# Patient Record
Sex: Female | Born: 1970
Health system: Southern US, Community
[De-identification: ages and names within clinical notes are randomized; demographics above are authoritative.]

## PROBLEM LIST (undated history)

## (undated) DIAGNOSIS — F988 Other specified behavioral and emotional disorders with onset usually occurring in childhood and adolescence: Secondary | ICD-10-CM

## (undated) DIAGNOSIS — J189 Pneumonia, unspecified organism: Secondary | ICD-10-CM

## (undated) DIAGNOSIS — D219 Benign neoplasm of connective and other soft tissue, unspecified: Secondary | ICD-10-CM

## (undated) DIAGNOSIS — K219 Gastro-esophageal reflux disease without esophagitis: Secondary | ICD-10-CM

## (undated) DIAGNOSIS — G43909 Migraine, unspecified, not intractable, without status migrainosus: Secondary | ICD-10-CM

## (undated) DIAGNOSIS — E538 Deficiency of other specified B group vitamins: Secondary | ICD-10-CM

## (undated) DIAGNOSIS — R87619 Unspecified abnormal cytological findings in specimens from cervix uteri: Secondary | ICD-10-CM

## (undated) DIAGNOSIS — F4310 Post-traumatic stress disorder, unspecified: Secondary | ICD-10-CM

## (undated) HISTORY — PX: TONSILLECTOMY: SUR1361

## (undated) HISTORY — DX: Post-traumatic stress disorder, unspecified: F43.10

## (undated) HISTORY — DX: Benign neoplasm of connective and other soft tissue, unspecified: D21.9

## (undated) HISTORY — PX: SPINE SURGERY: SHX786

## (undated) HISTORY — PX: OTHER SURGICAL HISTORY: SHX169

## (undated) HISTORY — PX: DILATION AND CURETTAGE OF UTERUS: SHX78

## (undated) HISTORY — PX: APPENDECTOMY: SHX54

## (undated) HISTORY — DX: Other specified behavioral and emotional disorders with onset usually occurring in childhood and adolescence: F98.8

## (undated) HISTORY — DX: Unspecified abnormal cytological findings in specimens from cervix uteri: R87.619

---

## 2003-02-12 HISTORY — PX: AUGMENTATION MAMMAPLASTY: SUR837

## 2007-10-31 ENCOUNTER — Ambulatory Visit: Payer: Self-pay | Admitting: Family Medicine

## 2010-08-17 DIAGNOSIS — F341 Dysthymic disorder: Secondary | ICD-10-CM | POA: Insufficient documentation

## 2010-08-17 DIAGNOSIS — F4310 Post-traumatic stress disorder, unspecified: Secondary | ICD-10-CM | POA: Insufficient documentation

## 2010-08-17 DIAGNOSIS — F988 Other specified behavioral and emotional disorders with onset usually occurring in childhood and adolescence: Secondary | ICD-10-CM | POA: Insufficient documentation

## 2010-08-17 DIAGNOSIS — F429 Obsessive-compulsive disorder, unspecified: Secondary | ICD-10-CM | POA: Insufficient documentation

## 2013-05-19 DIAGNOSIS — R002 Palpitations: Secondary | ICD-10-CM | POA: Insufficient documentation

## 2014-08-10 DIAGNOSIS — H5213 Myopia, bilateral: Secondary | ICD-10-CM | POA: Insufficient documentation

## 2014-08-10 DIAGNOSIS — H52203 Unspecified astigmatism, bilateral: Secondary | ICD-10-CM

## 2015-06-19 DIAGNOSIS — E538 Deficiency of other specified B group vitamins: Secondary | ICD-10-CM | POA: Insufficient documentation

## 2015-06-19 DIAGNOSIS — E559 Vitamin D deficiency, unspecified: Secondary | ICD-10-CM | POA: Insufficient documentation

## 2015-11-24 ENCOUNTER — Encounter: Payer: Self-pay | Admitting: Physician Assistant

## 2015-11-24 ENCOUNTER — Ambulatory Visit: Payer: Self-pay | Admitting: Physician Assistant

## 2015-11-24 VITALS — BP 94/70 | HR 82 | Temp 98.2°F

## 2015-11-24 DIAGNOSIS — B349 Viral infection, unspecified: Secondary | ICD-10-CM

## 2015-11-24 LAB — POCT INFLUENZA A/B
INFLUENZA A, POC: NEGATIVE
INFLUENZA B, POC: NEGATIVE

## 2015-11-24 MED ORDER — PROMETHAZINE HCL 25 MG PO TABS: 25.0000 mg | ORAL_TABLET | Freq: Three times a day (TID) | ORAL | 0 refills | Status: DC | PRN

## 2015-11-24 NOTE — Progress Notes (Signed)
S: C/o runny nose and congestion with dry cough for 1 days, + fever, chills, some v/d; denies cp/so; throat is sore;  cough is sporadic,   Using otc meds: robitussin  O: PE: vitals wnl, nad,  perrl eomi, normocephalic, tms dull, nasal mucosa red and swollen, throat injected, neck supple no lymph, lungs c t a, cv rrr, abd soft nontender, neuro intact, flu swab neg  A:  Acute flu like illness   P: drink fluids, continue regular meds , use otc meds of choice, return if not improving in 3- 5 days, return earlier if worsening  Phenergan for nausea

## 2016-01-03 ENCOUNTER — Ambulatory Visit: Payer: Self-pay | Admitting: Physician Assistant

## 2016-01-31 ENCOUNTER — Ambulatory Visit: Payer: Self-pay | Admitting: Internal Medicine

## 2016-03-19 ENCOUNTER — Ambulatory Visit: Payer: Self-pay | Admitting: Physician Assistant

## 2016-03-19 ENCOUNTER — Encounter: Payer: Self-pay | Admitting: Physician Assistant

## 2016-03-19 VITALS — BP 100/70 | HR 70 | Temp 97.9°F

## 2016-03-19 DIAGNOSIS — Z20828 Contact with and (suspected) exposure to other viral communicable diseases: Secondary | ICD-10-CM

## 2016-03-19 DIAGNOSIS — R110 Nausea: Secondary | ICD-10-CM

## 2016-03-19 LAB — POCT URINALYSIS DIPSTICK
Bilirubin, UA: NEGATIVE
GLUCOSE UA: NEGATIVE
KETONES UA: NEGATIVE
Leukocytes, UA: NEGATIVE
NITRITE UA: NEGATIVE
PH UA: 6
Protein, UA: NEGATIVE
RBC UA: NEGATIVE
Spec Grav, UA: 1.01
Urobilinogen, UA: 0.2

## 2016-03-19 LAB — POCT INFLUENZA A/B
INFLUENZA A, POC: NEGATIVE
INFLUENZA B, POC: NEGATIVE

## 2016-03-19 LAB — POCT URINE PREGNANCY: Preg Test, Ur: NEGATIVE

## 2016-03-19 MED ORDER — OSELTAMIVIR PHOSPHATE 75 MG PO CAPS: 75.0000 mg | ORAL_CAPSULE | Freq: Every day | ORAL | 0 refills | Status: DC

## 2016-03-19 NOTE — Progress Notes (Signed)
S: C/o runny nose and congestion, + fever, chills today, son tested + for flu and strep, denies cp/sob, v/d; states she has been dizzy and nauseated, some ear pain and pressure; no v/d,   Using otc meds:sudafed  O: PE: vitals wnl, nad,  perrl eomi, normocephalic, tms dull, nasal mucosa wnl throat pale pink,  neck supple no lymph, lungs c t a, cv rrr, neuro intact, flu swab neg, ua wnl, urine preg neg  A:  Dysfunction of eustachean tube, influenza exposure   P: drink fluids, continue regular meds , use otc meds of choice, return if not improving in 5 days, return earlier if worsening , pt does not want to take prednisone, advised she take ibuprofen with sudafed and flonase, if ear pain is not better in 1 week then she can call ENT for appointment, tamiflu preventive 1 po qd

## 2016-09-17 ENCOUNTER — Encounter: Payer: Self-pay | Admitting: *Deleted

## 2016-09-17 ENCOUNTER — Ambulatory Visit
Admission: EM | Admit: 2016-09-17 | Discharge: 2016-09-17 | Disposition: A | Discharge status: Home / Self Care | Payer: 59 | Attending: Emergency Medicine | Admitting: Emergency Medicine

## 2016-09-17 DIAGNOSIS — Z9889 Other specified postprocedural states: Secondary | ICD-10-CM | POA: Insufficient documentation

## 2016-09-17 DIAGNOSIS — H53149 Visual discomfort, unspecified: Secondary | ICD-10-CM | POA: Diagnosis not present

## 2016-09-17 DIAGNOSIS — Z8249 Family history of ischemic heart disease and other diseases of the circulatory system: Secondary | ICD-10-CM | POA: Insufficient documentation

## 2016-09-17 DIAGNOSIS — G43909 Migraine, unspecified, not intractable, without status migrainosus: Secondary | ICD-10-CM | POA: Diagnosis not present

## 2016-09-17 DIAGNOSIS — R55 Syncope and collapse: Secondary | ICD-10-CM | POA: Diagnosis not present

## 2016-09-17 DIAGNOSIS — Z79899 Other long term (current) drug therapy: Secondary | ICD-10-CM | POA: Diagnosis not present

## 2016-09-17 DIAGNOSIS — R51 Headache: Secondary | ICD-10-CM

## 2016-09-17 DIAGNOSIS — R519 Headache, unspecified: Secondary | ICD-10-CM

## 2016-09-17 DIAGNOSIS — F4310 Post-traumatic stress disorder, unspecified: Secondary | ICD-10-CM | POA: Diagnosis not present

## 2016-09-17 DIAGNOSIS — R002 Palpitations: Secondary | ICD-10-CM

## 2016-09-17 DIAGNOSIS — R42 Dizziness and giddiness: Secondary | ICD-10-CM

## 2016-09-17 DIAGNOSIS — R5383 Other fatigue: Secondary | ICD-10-CM | POA: Diagnosis not present

## 2016-09-17 DIAGNOSIS — R531 Weakness: Secondary | ICD-10-CM | POA: Insufficient documentation

## 2016-09-17 LAB — URINALYSIS, COMPLETE (UACMP) WITH MICROSCOPIC
BILIRUBIN URINE: NEGATIVE
Glucose, UA: NEGATIVE mg/dL
HGB URINE DIPSTICK: NEGATIVE
KETONES UR: NEGATIVE mg/dL
Leukocytes, UA: NEGATIVE
Nitrite: NEGATIVE
PROTEIN: NEGATIVE mg/dL
RBC / HPF: NONE SEEN RBC/hpf (ref 0–5)
Specific Gravity, Urine: 1.015 (ref 1.005–1.030)
pH: 7 (ref 5.0–8.0)

## 2016-09-17 LAB — CBC WITH DIFFERENTIAL/PLATELET
BASOS PCT: 0 %
Basophils Absolute: 0 10*3/uL (ref 0–0.1)
Eosinophils Absolute: 0.1 10*3/uL (ref 0–0.7)
Eosinophils Relative: 1 %
HEMATOCRIT: 40.2 % (ref 35.0–47.0)
HEMOGLOBIN: 13.6 g/dL (ref 12.0–16.0)
LYMPHS ABS: 2.9 10*3/uL (ref 1.0–3.6)
Lymphocytes Relative: 40 %
MCH: 33.7 pg (ref 26.0–34.0)
MCHC: 33.7 g/dL (ref 32.0–36.0)
MCV: 99.9 fL (ref 80.0–100.0)
MONOS PCT: 11 %
Monocytes Absolute: 0.8 10*3/uL (ref 0.2–0.9)
NEUTROS ABS: 3.5 10*3/uL (ref 1.4–6.5)
NEUTROS PCT: 48 %
Platelets: 195 10*3/uL (ref 150–440)
RBC: 4.02 MIL/uL (ref 3.80–5.20)
RDW: 12.8 % (ref 11.5–14.5)
WBC: 7.4 10*3/uL (ref 3.6–11.0)

## 2016-09-17 LAB — COMPREHENSIVE METABOLIC PANEL
ALT: 17 U/L (ref 14–54)
ANION GAP: 6 (ref 5–15)
AST: 19 U/L (ref 15–41)
Albumin: 4.7 g/dL (ref 3.5–5.0)
Alkaline Phosphatase: 41 U/L (ref 38–126)
BUN: 17 mg/dL (ref 6–20)
CALCIUM: 8.8 mg/dL — AB (ref 8.9–10.3)
CHLORIDE: 106 mmol/L (ref 101–111)
CO2: 24 mmol/L (ref 22–32)
Creatinine, Ser: 0.58 mg/dL (ref 0.44–1.00)
GFR calc non Af Amer: >60 mL/min (ref >60)
Glucose, Bld: 99 mg/dL (ref 65–99)
Potassium: 3.6 mmol/L (ref 3.5–5.1)
SODIUM: 136 mmol/L (ref 135–145)
Total Bilirubin: 0.8 mg/dL (ref 0.3–1.2)
Total Protein: 7.6 g/dL (ref 6.5–8.1)

## 2016-09-17 MED ORDER — ACETAMINOPHEN 500 MG PO TABS
1000.0000 mg | ORAL_TABLET | Freq: Once | ORAL | Status: AC
  Administered 2016-09-17: 1000 mg via ORAL

## 2016-09-17 MED ORDER — KETOROLAC TROMETHAMINE 30 MG/ML IJ SOLN
30.0000 mg | Freq: Once | INTRAMUSCULAR | Status: AC
  Administered 2016-09-17: 30 mg via INTRAVENOUS

## 2016-09-17 MED ORDER — SODIUM CHLORIDE 0.9 % IV BOLUS (SEPSIS)
1000.0000 mL | Freq: Once | INTRAVENOUS | Status: AC
  Administered 2016-09-17: 1000 mL via INTRAVENOUS

## 2016-09-17 MED ORDER — IBUPROFEN 600 MG PO TABS: 600.0000 mg | ORAL_TABLET | Freq: Four times a day (QID) | ORAL | 0 refills | Status: DC | PRN

## 2016-09-17 NOTE — ED Provider Notes (Signed)
HPI  SUBJECTIVE:  Jaime Fletcher is a 46 y.o. female who presents with weakness and fatigue starting today. She reports a gradual onset, constant diffuse headache especially behind her eyes starting today and some photophobia. States that her teeth and jaw hurt and Thought that this may be due to her new Invisilign braces so took these out with no improvement in her symptoms. She denies neck stiffness, rash. this is not the worst headache of her life, but states this feels different than her usual migraine headaches. She denies nasal congestion, sinus pain or pressure. She also reports multiple episodes of tunnel vision, palpitations described as her heart beating fast, dizziness described as lightheadedness lasting seconds and then resolving. She states that she noted her heart rate to be 50 on her watch during an episode. She says her heart rate is normally in the 80s and 90s.  States she was at her desk when this started. States she tried pushing fluids, Gatorade and walking around without improvement in her symptoms. She states that her symptoms get worse with walking and with large positional changes. She denies irregular heartbeat. No vomiting, tinnitus, vertigo, diaphoresis. No chest pain, shortness of breath, abdominal pain. No coughing, wheezing, hemoptysis. No calf pain, swelling. No syncope. No abdominal pain. She states that she is eating and drinking well. She denies any urinary symptoms. No epistaxis, heavy vaginal bleeding, melena, hematochezia, diarrhea. No change in her medications. No recent immobilization or surgery in the past 4 weeks. No recent trauma. Past medical history of anxiety, PTSD. She takes Klonopin and Xanax PRN. States that she is okay to skip a dose or 2 of these. No history of PE, DVT, diabetes, hypertension, MI, arrhythmia, hypercholesterolemia, HIV, smoking. She has a history of migraines and tension headaches. She has had a syncopal episode due to heat. Family history  significant for mother with an MI in her mid 56s. No history of sudden death. LMP: Amenorrheic due to Mirena. Denies possibility of being pregnant. PMD: Dr. Tamala Fothergill at [facility].  History reviewed. No pertinent past medical history.  Past Surgical History:  Procedure Laterality Date  . APPENDECTOMY    . SPINE SURGERY    . TONSILLECTOMY      History reviewed. No pertinent family history.  Social History  Substance Use Topics  . Smoking status: Never Smoker  . Smokeless tobacco: Never Used  . Alcohol use Yes    No current facility-administered medications for this encounter.   Current Outpatient Prescriptions:  .  ALPRAZolam (XANAX) 0.5 MG tablet, Take 0.5 mg by mouth at bedtime as needed for anxiety., Disp: , Rfl:  .  levonorgestrel (MIRENA) 20 MCG/24HR IUD, 1 each by Intrauterine route once., Disp: , Rfl:  .  ibuprofen (ADVIL,MOTRIN) 600 MG tablet, Take 1 tablet (600 mg total) by mouth every 6 (six) hours as needed., Disp: 30 tablet, Rfl: 0 .  vitamin B-12 (CYANOCOBALAMIN) 1000 MCG tablet, Take 1,000 mcg by mouth daily., Disp: , Rfl:   No Known Allergies   ROS  As noted in HPI.   Physical Exam  BP (!) 108/53 (BP Location: Left Arm)   Pulse 66   Temp 98.6 F (37 C) (Oral)   Resp 16   Ht 5\' 1"  (1.549 m)   Wt 110 lb (49.9 kg)   SpO2 100%   BMI 20.78 kg/m  Orthostatic VS for the past 24 hrs:  BP- Lying Pulse- Lying BP- Sitting Pulse- Sitting BP- Standing at 0 minutes Pulse- Standing  at 0 minutes  09/17/16 1752 101/48 63 102/58 77 104/63 77    Constitutional: Well developed, well nourished, no acute distress Eyes: PERRL, EOMI, conjunctiva normal bilaterally Mild bilateral photophobia HENT: Normocephalic, atraumatic,mucus membranes moist. No TMJ tenderness. TMs normal. No nasal congestion. No sinus tenderness. Positive diffuse dental tenderness. No dental caries. No temporal artery tenderness.  Neck: No cervical lymphadenopathy, meningismus. No trapezial  tenderness, muscle spasm. Endocrine: No thyroid tenderness or enlargement. Respiratory: Clear to auscultation bilaterally, no rales, no wheezing, no rhonchi Cardiovascular: Normal rate and rhythm, no murmurs, no gallops, no rubs GI: Soft, nondistended, normal bowel sounds, nontender, no rebound, no guarding Back: no CVAT skin: No rash, skin intact Musculoskeletal: No edema, no tenderness, no deformities Neurologic: Alert & oriented x 3, CN II-XII  intact, finger-nose, heel shin within normal limits. Tandem gait steady. Romberg negative. no motor deficits, sensation grossly intact Psychiatric: Speech and behavior appropriate   ED Course   Medications  acetaminophen (TYLENOL) tablet 1,000 mg (1,000 mg Oral Given 09/17/16 1901)  ketorolac (TORADOL) 30 MG/ML injection 30 mg (30 mg Intravenous Given 09/17/16 1917)  sodium chloride 0.9 % bolus 1,000 mL (1,000 mLs Intravenous New Bag/Given 09/17/16 1914)    Orders Placed This Encounter  Procedures  . Urinalysis, Complete w Microscopic    Standing Status:   Standing    Number of Occurrences:   1  . Comprehensive metabolic panel    Standing Status:   Standing    Number of Occurrences:   1  . CBC with Differential    Standing Status:   Standing    Number of Occurrences:   1  . Orthostatic vital signs    Standing Status:   Standing    Number of Occurrences:   1  . EKG 12-Lead    Standing Status:   Standing    Number of Occurrences:   1  . Insert peripheral IV    Standing Status:   Standing    Number of Occurrences:   1   Results for orders placed or performed during the hospital encounter of 09/17/16 (from the past 24 hour(s))  Urinalysis, Complete w Microscopic     Status: Abnormal   Collection Time: 09/17/16  6:33 PM  Result Value Ref Range   Color, Urine YELLOW YELLOW   APPearance CLEAR CLEAR   Specific Gravity, Urine 1.015 1.005 - 1.030   pH 7.0 5.0 - 8.0   Glucose, UA NEGATIVE NEGATIVE mg/dL   Hgb urine dipstick NEGATIVE  NEGATIVE   Bilirubin Urine NEGATIVE NEGATIVE   Ketones, ur NEGATIVE NEGATIVE mg/dL   Protein, ur NEGATIVE NEGATIVE mg/dL   Nitrite NEGATIVE NEGATIVE   Leukocytes, UA NEGATIVE NEGATIVE   Squamous Epithelial / LPF 6-30 (A) NONE SEEN   WBC, UA 0-5 0 - 5 WBC/hpf   RBC / HPF NONE SEEN 0 - 5 RBC/hpf   Bacteria, UA RARE (A) NONE SEEN  Comprehensive metabolic panel     Status: Abnormal   Collection Time: 09/17/16  6:57 PM  Result Value Ref Range   Sodium 136 135 - 145 mmol/L   Potassium 3.6 3.5 - 5.1 mmol/L   Chloride 106 101 - 111 mmol/L   CO2 24 22 - 32 mmol/L   Glucose, Bld 99 65 - 99 mg/dL   BUN 17 6 - 20 mg/dL   Creatinine, Ser 0.58 0.44 - 1.00 mg/dL   Calcium 8.8 (L) 8.9 - 10.3 mg/dL   Total Protein 7.6 6.5 - 8.1 g/dL  Albumin 4.7 3.5 - 5.0 g/dL   AST 19 15 - 41 U/L   ALT 17 14 - 54 U/L   Alkaline Phosphatase 41 38 - 126 U/L   Total Bilirubin 0.8 0.3 - 1.2 mg/dL   GFR calc non Af Amer >60 >60 mL/min   GFR calc Af Amer >60 >60 mL/min   Anion gap 6 5 - 15  CBC with Differential     Status: None   Collection Time: 09/17/16  6:57 PM  Result Value Ref Range   WBC 7.4 3.6 - 11.0 K/uL   RBC 4.02 3.80 - 5.20 MIL/uL   Hemoglobin 13.6 12.0 - 16.0 g/dL   HCT 40.2 35.0 - 47.0 %   MCV 99.9 80.0 - 100.0 fL   MCH 33.7 26.0 - 34.0 pg   MCHC 33.7 32.0 - 36.0 g/dL   RDW 12.8 11.5 - 14.5 %   Platelets 195 150 - 440 K/uL   Neutrophils Relative % 48 %   Neutro Abs 3.5 1.4 - 6.5 K/uL   Lymphocytes Relative 40 %   Lymphs Abs 2.9 1.0 - 3.6 K/uL   Monocytes Relative 11 %   Monocytes Absolute 0.8 0.2 - 0.9 K/uL   Eosinophils Relative 1 %   Eosinophils Absolute 0.1 0 - 0.7 K/uL   Basophils Relative 0 %   Basophils Absolute 0.0 0 - 0.1 K/uL   No results found.  ED Clinical Impression  Acute nonintractable headache, unspecified headache type  Lightheadedness   ED Assessment/Plan  EKG: Normal sinus rhythm, rate 69. Normal axis, normal intervals. No hypertrophy. No ST T wave changes.  No previous EKG for comparison.  Patient is not orthostatic but states that she was symptomatic while doing this.  1850-She had no episodes of bradycardia while in the department. She has no evidence of stroke or neurologic Emergency. discussed with the patient about the possibility of going to the ED, she states that she does not want to go. She states that she would prefer to have a limited workup here, get some fluids, Toradol, Tylenol. If she is completely better after this, then will send her home. If she does not improve, then sending her to the ER via EMS. She agrees with plan.  Checking CBC to rule out anemia, CMP to rule out electrolyte disorders and evaluate kidney function.  Labs reviewed. Normal CBC, CMP, UA. Reassuring EKG.  On reevaluation, she states that she feels significantly better after the IV fluids. She's had no further episodes of tunnel vision/lightheadedness dizziness. and Her headache has improved significantly after the Toradol and Tylenol. Patient may have been borderline dehydrated.   Discussed labs, imaging, MDM, plan and followup with patient. Discussed sn/sx that should prompt return to the ED. Patient agrees with plan.   Meds ordered this encounter  Medications  . acetaminophen (TYLENOL) tablet 1,000 mg  . ketorolac (TORADOL) 30 MG/ML injection 30 mg  . sodium chloride 0.9 % bolus 1,000 mL  . ibuprofen (ADVIL,MOTRIN) 600 MG tablet    Sig: Take 1 tablet (600 mg total) by mouth every 6 (six) hours as needed.    Dispense:  30 tablet    Refill:  0    *This clinic note was created using [employer]. Therefore, there may be occasional mistakes despite careful proofreading.  ?   Melynda Ripple, MD 09/17/16 2020

## 2016-09-17 NOTE — Discharge Instructions (Signed)
Push fluids, 600 mg ibuprofen with 1 gram of tylenol together as needed for headache. Go to the ER or call 911 for chest pain, pressure, heaviness, if these spells start to last longer, if your heart rate stays below 60 for a prolonged period of time, if you pass out or for other concerns

## 2016-09-17 NOTE — ED Triage Notes (Signed)
Sudden onset nausea, dizziness, and palpitations approx 2 hours ago. States feels weak and has noticed her heart rate slowing down.

## 2016-11-12 DIAGNOSIS — R51 Headache: Secondary | ICD-10-CM | POA: Diagnosis not present

## 2016-11-20 ENCOUNTER — Telehealth: Payer: Self-pay | Admitting: Obstetrics and Gynecology

## 2016-11-20 ENCOUNTER — Encounter: Payer: Self-pay | Admitting: Obstetrics and Gynecology

## 2016-11-20 ENCOUNTER — Ambulatory Visit (INDEPENDENT_AMBULATORY_CARE_PROVIDER_SITE_OTHER): Payer: 59 | Admitting: Obstetrics and Gynecology

## 2016-11-20 VITALS — BP 104/61 | HR 97 | Ht 61.0 in | Wt 114.2 lb

## 2016-11-20 DIAGNOSIS — Z30431 Encounter for routine checking of intrauterine contraceptive device: Secondary | ICD-10-CM

## 2016-11-20 DIAGNOSIS — E559 Vitamin D deficiency, unspecified: Secondary | ICD-10-CM

## 2016-11-20 DIAGNOSIS — Z01419 Encounter for gynecological examination (general) (routine) without abnormal findings: Secondary | ICD-10-CM

## 2016-11-20 DIAGNOSIS — Z30430 Encounter for insertion of intrauterine contraceptive device: Secondary | ICD-10-CM | POA: Insufficient documentation

## 2016-11-20 DIAGNOSIS — M797 Fibromyalgia: Secondary | ICD-10-CM | POA: Insufficient documentation

## 2016-11-20 DIAGNOSIS — Z1231 Encounter for screening mammogram for malignant neoplasm of breast: Secondary | ICD-10-CM | POA: Diagnosis not present

## 2016-11-20 DIAGNOSIS — E538 Deficiency of other specified B group vitamins: Secondary | ICD-10-CM | POA: Diagnosis not present

## 2016-11-20 DIAGNOSIS — F419 Anxiety disorder, unspecified: Secondary | ICD-10-CM | POA: Insufficient documentation

## 2016-11-20 DIAGNOSIS — R87619 Unspecified abnormal cytological findings in specimens from cervix uteri: Secondary | ICD-10-CM

## 2016-11-20 DIAGNOSIS — Z1239 Encounter for other screening for malignant neoplasm of breast: Secondary | ICD-10-CM

## 2016-11-20 NOTE — Progress Notes (Signed)
ANNUAL PREVENTATIVE CARE GYN  ENCOUNTER NOTE  Subjective:       Jaime Fletcher is a 46 y.o. G45P2013 female here for a routine annual gynecologic exam.  Current complaints: 1. Possible perimenopause 2. History of cervical CIS - Cold knife conization - 1996   Mirena currently in place as contraception. Denies periods, endorses occasional pink discharge that has lessened with time. Denies hot flashes/night sweats. Denies vaginal bleeding, dryness, itchiness.  History of cervical CIS, getting PAPs yearly. Endorses one abnormal result after cone knife conization, repeat three months later was normal.    Gynecologic History No LMP recorded (lmp unknown). Patient is not currently having periods (Reason: IUD). Contraception: IUD Last Pap: 05/2015 wnl. Results were: normal Last mammogram: 05/2015 wnl. Results were: normal  Obstetric History OB History  Gravida Para Term Preterm AB Living  3 2 2   1 3   SAB TAB Ectopic Multiple Live Births  1     1 3     # Outcome Date GA Lbr Len/2nd Weight Sex Delivery Anes PTL Lv  3 Term 2003   6 lb 14.4 oz (3.13 kg) M Vag-Spont   LIV  2 SAB 1996          1A Term 1991   5 lb 14.4 oz (2.676 kg) M Vag-Spont   LIV  1B Term 1991   4 lb (1.814 kg) F Vag-Spont   LIV      Past Medical History:  Diagnosis Date  . ADD (attention deficit disorder)   . PTSD (post-traumatic stress disorder)     Past Surgical History:  Procedure Laterality Date  . APPENDECTOMY    . DILATION AND CURETTAGE OF UTERUS    . SPINE SURGERY    . TONSILLECTOMY      Current Outpatient Prescriptions on File Prior to Visit  Medication Sig Dispense Refill  . ALPRAZolam (XANAX) 0.5 MG tablet Take 0.5 mg by mouth at bedtime as needed for anxiety.    Marland Kitchen levonorgestrel (MIRENA) 20 MCG/24HR IUD 1 each by Intrauterine route once.    . vitamin B-12 (CYANOCOBALAMIN) 1000 MCG tablet Take 1,000 mcg by mouth daily.     No current facility-administered medications on file prior to visit.      No Known Allergies  Social History   Social History  . Marital status: Divorced    Spouse name: N/A  . Number of children: N/A  . Years of education: N/A   Occupational History  . Not on file.   Social History Main Topics  . Smoking status: Never Smoker  . Smokeless tobacco: Never Used  . Alcohol use Yes  . Drug use: No  . Sexual activity: Not on file   Other Topics Concern  . Not on file   Social History Narrative  . No narrative on file    History reviewed. No pertinent family history.  The following portions of the patient's history were reviewed and updated as appropriate: allergies, current medications, past family history, past medical history, past social history, past surgical history and problem list.  Review of Systems Review of Systems  Constitutional: Negative for chills, fever and malaise/fatigue.  HENT: Negative for hearing loss and tinnitus.   Eyes: Negative for blurred vision, double vision and pain.  Respiratory: Negative for cough, shortness of breath and wheezing.   Cardiovascular: Negative for chest pain, palpitations, claudication and leg swelling.  Gastrointestinal: Negative for abdominal pain, constipation, diarrhea, nausea and vomiting.  Genitourinary: Negative for dysuria, frequency and urgency.  Musculoskeletal: Negative for myalgias.  Skin: Negative for rash.  Neurological: Negative for dizziness, loss of consciousness, weakness and headaches.     Objective:   BP 104/61   Pulse 97   Ht 5\' 1"  (1.549 m)   Wt 114 lb 3.2 oz (51.8 kg)   LMP  (LMP Unknown) Comment: iud  BMI 21.58 kg/m  CONSTITUTIONAL: Well-developed, well-nourished female in no acute distress.  PSYCHIATRIC: Normal mood and affect. Normal behavior. Normal judgment and thought content. [place]: Alert and oriented to person, place, and time. Normal muscle tone coordination. No cranial nerve deficit noted. HENT:  Normocephalic, atraumatic, External right and left ear  normal. Oropharynx is clear and moist EYES: Conjunctivae and EOM are normal. Pupils are equal, round, and reactive to light. No scleral icterus.  NECK: Normal range of motion, supple, no masses.  Normal thyroid.  SKIN: Skin is warm and dry. No rash noted. Not diaphoretic. No erythema. No pallor. CARDIOVASCULAR: Normal heart rate noted, regular rhythm, no murmur. RESPIRATORY: Clear to auscultation bilaterally. Effort and breath sounds normal, no problems with respiration noted. BREASTS: Symmetric in size. No masses, skin changes, nipple drainage, or lymphadenopathy. ABDOMEN: Soft, normal bowel sounds, no distention noted.  No tenderness, rebound or guarding.  BLADDER: Normal PELVIC:  External Genitalia: Normal  BUS: Normal  Vagina: Normal  Cervix: Normal, no cervical motion tenderness; IUD strings 2 cm  Uterus: anteverted, small size, mobile, nontender  Adnexa: Normal  RV: External Exam NormaI, No Rectal Masses and Normal Sphincter tone  MUSCULOSKELETAL: Normal range of motion. No tenderness.  No cyanosis, clubbing, or edema.  2+ distal pulses. LYMPHATIC: No Axillary, Supraclavicular, or Inguinal Adenopathy.   Assessment:   Annual gynecologic examination 46 y.o. Contraception: IUD Normal BMI Problem List Items Addressed This Visit    None      Plan:  Pap: Pap Co Test Mammogram: Ordered Stool Guaiac Testing:  Not Indicated Labs: lipid fbs a1c tsh Routine preventative health maintenance measures emphasized: Exercise/Diet/Weight control, Tobacco Warnings, Alcohol/Substance use risks, Stress Management and Safe Sex Return to Clinic - [address] [place], CMA  [employer], PA-S Brayton Mars, MD   I have seen, interviewed, and examined the patient in conjunction with the [employer].A. student and affirm the diagnosis and management plan. Icela Glymph A. Apryle Stowell, MD, FACOG   Note: This dictation was prepared with Dragon dictation along with smaller phrase  technology. Any transcriptional errors that result from this process are unintentional.

## 2016-11-20 NOTE — Telephone Encounter (Signed)
Patient would like to add vit d, b12 and iron to her lab work .Thanks

## 2016-11-20 NOTE — Patient Instructions (Signed)
1. Pap smear is done 2. Mammogram is ordered 3. Screening labs are ordered 4. Contraception-Mirena IUD 5. Continue with healthy eating and exercise 6. Return in 1 year for annual exam  Health Maintenance, Female Adopting a healthy lifestyle and getting preventive care can go a long way to promote health and wellness. Talk with your health care provider about what schedule of regular examinations is right for you. This is a good chance for you to check in with your provider about disease prevention and staying healthy. In between checkups, there are plenty of things you can do on your own. Experts have done a lot of research about which lifestyle changes and preventive measures are most likely to keep you healthy. Ask your health care provider for more information. Weight and diet Eat a healthy diet  Be sure to include plenty of vegetables, fruits, low-fat dairy products, and lean protein.  Do not eat a lot of foods high in solid fats, added sugars, or salt.  Get regular exercise. This is one of the most important things you can do for your health. ? Most adults should exercise for at least 150 minutes each week. The exercise should increase your heart rate and make you sweat (moderate-intensity exercise). ? Most adults should also do strengthening exercises at least twice a week. This is in addition to the moderate-intensity exercise.  Maintain a healthy weight  Body mass index (BMI) is a measurement that can be used to identify possible weight problems. It estimates body fat based on height and weight. Your health care provider can help determine your BMI and help you achieve or maintain a healthy weight.  For females 52 years of age and older: ? A BMI below 18.5 is considered underweight. ? A BMI of 18.5 to 24.9 is normal. ? A BMI of 25 to 29.9 is considered overweight. ? A BMI of 30 and above is considered obese.  Watch levels of cholesterol and blood lipids  You should start  having your blood tested for lipids and cholesterol at 45 years of age, then have this test every 5 years.  You may need to have your cholesterol levels checked more often if: ? Your lipid or cholesterol levels are high. ? You are older than 45 years of age. ? You are at high risk for heart disease.  Cancer screening Lung Cancer  Lung cancer screening is recommended for adults 67-49 years old who are at high risk for lung cancer because of a history of smoking.  A yearly low-dose CT scan of the lungs is recommended for people who: ? Currently smoke. ? Have quit within the past 15 years. ? Have at least a 30-pack-year history of smoking. A pack year is smoking an average of one pack of cigarettes a day for 1 year.  Yearly screening should continue until it has been 15 years since you quit.  Yearly screening should stop if you develop a health problem that would prevent you from having lung cancer treatment.  Breast Cancer  Practice breast self-awareness. This means understanding how your breasts normally appear and feel.  It also means doing regular breast self-exams. Let your health care provider know about any changes, no matter how small.  If you are in your 20s or 30s, you should have a clinical breast exam (CBE) by a health care provider every 1-3 years as part of a regular health exam.  If you are 70 or older, have a CBE every year. Also consider  having a breast X-ray (mammogram) every year.  If you have a family history of breast cancer, talk to your health care provider about genetic screening.  If you are at high risk for breast cancer, talk to your health care provider about having an MRI and a mammogram every year.  Breast cancer gene (BRCA) assessment is recommended for women who have family members with BRCA-related cancers. BRCA-related cancers include: ? Breast. ? Ovarian. ? Tubal. ? Peritoneal cancers.  Results of the assessment will determine the need for  genetic counseling and BRCA1 and BRCA2 testing.  Cervical Cancer Your health care provider may recommend that you be screened regularly for cancer of the pelvic organs (ovaries, uterus, and vagina). This screening involves a pelvic examination, including checking for microscopic changes to the surface of your cervix (Pap test). You may be encouraged to have this screening done every 3 years, beginning at age 48.  For women ages 22-65, health care providers may recommend pelvic exams and Pap testing every 3 years, or they may recommend the Pap and pelvic exam, combined with testing for human papilloma virus (HPV), every 5 years. Some types of HPV increase your risk of cervical cancer. Testing for HPV may also be done on women of any age with unclear Pap test results.  Other health care providers may not recommend any screening for nonpregnant women who are considered low risk for pelvic cancer and who do not have symptoms. Ask your health care provider if a screening pelvic exam is right for you.  If you have had past treatment for cervical cancer or a condition that could lead to cancer, you need Pap tests and screening for cancer for at least 20 years after your treatment. If Pap tests have been discontinued, your risk factors (such as having a new sexual partner) need to be reassessed to determine if screening should resume. Some women have medical problems that increase the chance of getting cervical cancer. In these cases, your health care provider may recommend more frequent screening and Pap tests.  Colorectal Cancer  This type of cancer can be detected and often prevented.  Routine colorectal cancer screening usually begins at 45 years of age and continues through 45 years of age.  Your health care provider may recommend screening at an earlier age if you have risk factors for colon cancer.  Your health care provider may also recommend using home test kits to check for hidden blood in the  stool.  A small camera at the end of a tube can be used to examine your colon directly (sigmoidoscopy or colonoscopy). This is done to check for the earliest forms of colorectal cancer.  Routine screening usually begins at age 65.  Direct examination of the colon should be repeated every 5-10 years through 45 years of age. However, you may need to be screened more often if early forms of precancerous polyps or small growths are found.  Skin Cancer  Check your skin from head to toe regularly.  Tell your health care provider about any new moles or changes in moles, especially if there is a change in a mole's shape or color.  Also tell your health care provider if you have a mole that is larger than the size of a pencil eraser.  Always use sunscreen. Apply sunscreen liberally and repeatedly throughout the day.  Protect yourself by wearing long sleeves, pants, a wide-brimmed hat, and sunglasses whenever you are outside.  Heart disease, diabetes, and high blood pressure  High blood pressure causes heart disease and increases the risk of stroke. High blood pressure is more likely to develop in: ? People who have blood pressure in the high end of the normal range (130-139/85-89 mm Hg). ? People who are overweight or obese. ? People who are African American.  If you are 14-51 years of age, have your blood pressure checked every 3-5 years. If you are 50 years of age or older, have your blood pressure checked every year. You should have your blood pressure measured twice-once when you are at a hospital or clinic, and once when you are not at a hospital or clinic. Record the average of the two measurements. To check your blood pressure when you are not at a hospital or clinic, you can use: ? An automated blood pressure machine at a pharmacy. ? A home blood pressure monitor.  If you are between 6 years and 19 years old, ask your health care provider if you should take aspirin to prevent  strokes.  Have regular diabetes screenings. This involves taking a blood sample to check your fasting blood sugar level. ? If you are at a normal weight and have a low risk for diabetes, have this test once every three years after 45 years of age. ? If you are overweight and have a high risk for diabetes, consider being tested at a younger age or more often. Preventing infection Hepatitis B  If you have a higher risk for hepatitis B, you should be screened for this virus. You are considered at high risk for hepatitis B if: ? You were born in a country where hepatitis B is common. Ask your health care provider which countries are considered high risk. ? Your parents were born in a high-risk country, and you have not been immunized against hepatitis B (hepatitis B vaccine). ? You have HIV or AIDS. ? You use needles to inject street drugs. ? You live with someone who has hepatitis B. ? You have had sex with someone who has hepatitis B. ? You get hemodialysis treatment. ? You take certain medicines for conditions, including cancer, organ transplantation, and autoimmune conditions.  Hepatitis C  Blood testing is recommended for: ? Everyone born from [birth year] through 1964. ? Anyone with known risk factors for hepatitis C.  Sexually transmitted infections (STIs)  You should be screened for sexually transmitted infections (STIs) including gonorrhea and chlamydia if: ? You are sexually active and are younger than 45 years of age. ? You are older than 45 years of age and your health care provider tells you that you are at risk for this type of infection. ? Your sexual activity has changed since you were last screened and you are at an increased risk for chlamydia or gonorrhea. Ask your health care provider if you are at risk.  If you do not have HIV, but are at risk, it may be recommended that you take a prescription medicine daily to prevent HIV infection. This is called pre-exposure prophylaxis  (PrEP). You are considered at risk if: ? You are sexually active and do not regularly use condoms or know the HIV status of your partner(s). ? You take drugs by injection. ? You are sexually active with a partner who has HIV.  Talk with your health care provider about whether you are at high risk of being infected with HIV. If you choose to begin PrEP, you should first be tested for HIV. You should then be tested every 3 months  for as long as you are taking PrEP. Pregnancy  If you are premenopausal and you may become pregnant, ask your health care provider about preconception counseling.  If you may become pregnant, take 400 to 800 micrograms (mcg) of folic acid every day.  If you want to prevent pregnancy, talk to your health care provider about birth control (contraception). Osteoporosis and menopause  Osteoporosis is a disease in which the bones lose minerals and strength with aging. This can result in serious bone fractures. Your risk for osteoporosis can be identified using a bone density scan.  If you are 8 years of age or older, or if you are at risk for osteoporosis and fractures, ask your health care provider if you should be screened.  Ask your health care provider whether you should take a calcium or vitamin D supplement to lower your risk for osteoporosis.  Menopause may have certain physical symptoms and risks.  Hormone replacement therapy may reduce some of these symptoms and risks. Talk to your health care provider about whether hormone replacement therapy is right for you. Follow these instructions at home:  Schedule regular health, dental, and eye exams.  Stay current with your immunizations.  Do not use any tobacco products including cigarettes, chewing tobacco, or electronic cigarettes.  If you are pregnant, do not drink alcohol.  If you are breastfeeding, limit how much and how often you drink alcohol.  Limit alcohol intake to no more than 1 drink per day for  nonpregnant women. One drink equals 12 ounces of beer, 5 ounces of wine, or 1 ounces of hard liquor.  Do not use street drugs.  Do not share needles.  Ask your health care provider for help if you need support or information about quitting drugs.  Tell your health care provider if you often feel depressed.  Tell your health care provider if you have ever been abused or do not feel safe at home. This information is not intended to replace advice given to you by your health care provider. Make sure you discuss any questions you have with your health care provider. Document Released: 08/13/2010 Document Revised: 07/06/2015 Document Reviewed: 11/01/2014 Elsevier Interactive Patient Education  [employer].

## 2016-11-20 NOTE — Telephone Encounter (Signed)
Pt aware per vm labs ordered per her request. Advised to contact office to schedule lab appt. Needs to be fasting.

## 2016-11-22 LAB — IGP, COBASHPV16/18
HPV 16: NEGATIVE
HPV 18: NEGATIVE
HPV other hr types: NEGATIVE
PAP Smear Comment: 0

## 2017-03-13 ENCOUNTER — Encounter: Payer: Self-pay | Admitting: Obstetrics and Gynecology

## 2017-03-14 ENCOUNTER — Other Ambulatory Visit: Payer: Self-pay

## 2017-03-14 MED ORDER — BUPROPION HCL ER (XL) 150 MG PO TB24: 150.0000 mg | ORAL_TABLET | Freq: Every day | ORAL | 1 refills | Status: DC

## 2017-04-24 DIAGNOSIS — H52223 Regular astigmatism, bilateral: Secondary | ICD-10-CM | POA: Diagnosis not present

## 2017-04-24 DIAGNOSIS — H524 Presbyopia: Secondary | ICD-10-CM | POA: Diagnosis not present

## 2017-04-24 DIAGNOSIS — H5213 Myopia, bilateral: Secondary | ICD-10-CM | POA: Diagnosis not present

## 2017-04-24 DIAGNOSIS — R51 Headache: Secondary | ICD-10-CM | POA: Diagnosis not present

## 2017-05-09 DIAGNOSIS — F4310 Post-traumatic stress disorder, unspecified: Secondary | ICD-10-CM | POA: Diagnosis not present

## 2017-05-26 DIAGNOSIS — F4310 Post-traumatic stress disorder, unspecified: Secondary | ICD-10-CM | POA: Diagnosis not present

## 2017-06-10 ENCOUNTER — Other Ambulatory Visit: Payer: 59

## 2017-08-05 ENCOUNTER — Encounter: Payer: Self-pay | Admitting: Family Medicine

## 2017-08-05 ENCOUNTER — Ambulatory Visit (INDEPENDENT_AMBULATORY_CARE_PROVIDER_SITE_OTHER): Payer: 59 | Admitting: Family Medicine

## 2017-08-05 VITALS — BP 100/60 | HR 72 | Ht 61.0 in | Wt 111.0 lb

## 2017-08-05 DIAGNOSIS — R5381 Other malaise: Secondary | ICD-10-CM

## 2017-08-05 DIAGNOSIS — F419 Anxiety disorder, unspecified: Secondary | ICD-10-CM | POA: Diagnosis not present

## 2017-08-05 DIAGNOSIS — Z7689 Persons encountering health services in other specified circumstances: Secondary | ICD-10-CM

## 2017-08-05 DIAGNOSIS — E538 Deficiency of other specified B group vitamins: Secondary | ICD-10-CM | POA: Diagnosis not present

## 2017-08-05 DIAGNOSIS — R5383 Other fatigue: Secondary | ICD-10-CM | POA: Diagnosis not present

## 2017-08-05 DIAGNOSIS — K219 Gastro-esophageal reflux disease without esophagitis: Secondary | ICD-10-CM

## 2017-08-05 DIAGNOSIS — F32A Depression, unspecified: Secondary | ICD-10-CM | POA: Insufficient documentation

## 2017-08-05 DIAGNOSIS — F329 Major depressive disorder, single episode, unspecified: Secondary | ICD-10-CM | POA: Insufficient documentation

## 2017-08-05 DIAGNOSIS — F988 Other specified behavioral and emotional disorders with onset usually occurring in childhood and adolescence: Secondary | ICD-10-CM

## 2017-08-05 MED ORDER — PANTOPRAZOLE SODIUM 40 MG PO TBEC: 40.0000 mg | DELAYED_RELEASE_TABLET | Freq: Every day | ORAL | 1 refills | Status: DC

## 2017-08-05 MED ORDER — CYANOCOBALAMIN 1000 MCG/ML IJ SOLN: 1000.0000 ug | INTRAMUSCULAR | 5 refills | Status: DC

## 2017-08-05 NOTE — Assessment & Plan Note (Signed)
Sent in Rx for B12 injections

## 2017-08-05 NOTE — Assessment & Plan Note (Signed)
Continue seeing Dr Randel Books at [facility]

## 2017-08-05 NOTE — Assessment & Plan Note (Signed)
Continue seeing Dr Randel Books at [facility]

## 2017-08-05 NOTE — Progress Notes (Signed)
Name: Jaime Fletcher   MRN: [id number]    DOB: Jul 21, 1970   Date:08/05/2017       Progress Note  Subjective  Chief Complaint  Chief Complaint  Patient presents with  . Establish Care    moving care from [facility] to here.  . jittery    feeling "jittery all of a sudden, vision changes to blurry/ can't focus, and hear a humming sound in my ears" Has happened when she is stressed or just laying in bed. Ate half of an oreo cookie bar when this occured one time and it made her feel better. Been going on x 3 weeks    Dizziness  This is a new problem. Episode onset: 3 weeks. The problem occurs intermittently (multiple times a day to skipping). The problem has been unchanged. Associated symptoms include chest pain, headaches, nausea and a visual change. Pertinent negatives include no abdominal pain, anorexia, arthralgias, change in bowel habit, chills, congestion, coughing, diaphoresis, fatigue, fever, joint swelling, myalgias, neck pain, numbness, rash, sore throat, swollen glands, urinary symptoms, vertigo, vomiting or weakness. Associated symptoms comments: Associated episodes. The symptoms are aggravated by stress. She has tried eating for the symptoms. The treatment provided moderate relief.  Diabetes  Hypoglycemia symptoms include confusion, dizziness, headaches, mood changes and nervousness/anxiousness. Pertinent negatives for hypoglycemia include no hunger, pallor, seizures, sleepiness, speech difficulty, sweats or tremors. Associated symptoms include chest pain and visual change. Pertinent negatives for diabetes include no blurred vision, no fatigue, no polydipsia, no weakness and no weight loss. There are no diabetic complications.  Thyroid Problem  Presents for follow-up visit. Symptoms include anxiety, cold intolerance, dry skin, palpitations and visual change. Patient reports no constipation, depressed mood, diaphoresis, diarrhea, fatigue, hair loss, heat intolerance, hoarse voice, leg  swelling, menstrual problem, nail problem, tremors, weight gain or weight loss. The symptoms have been stable.    Anxiety Continue seeing Dr Randel Books at [facility]  Attention deficit disorder Continue seeing Dr Randel Books at [place] in Rx for B12 injections   Past Medical History:  Diagnosis Date  . Abnormal Pap smear of cervix    dysplasia  . ADD (attention deficit disorder)   . PTSD (post-traumatic stress disorder)     Past Surgical History:  Procedure Laterality Date  . APPENDECTOMY    . cold knife bx    . DILATION AND CURETTAGE OF UTERUS    . SPINE SURGERY    . TONSILLECTOMY      Family History  Problem Relation Age of Onset  . Diabetes Mother   . Breast cancer Paternal Aunt   . Heart failure Maternal Grandmother   . Ovarian cancer Neg Hx   . Colon cancer Neg Hx     Social History   Socioeconomic History  . Marital status: Divorced    Spouse name: Not on file  . Number of children: Not on file  . Years of education: Not on file  . Highest education level: Not on file  Occupational History  . Not on file  Social Needs  . Financial resource strain: Not on file  . Food insecurity:    Worry: Not on file    Inability: Not on file  . Transportation needs:    Medical: Not on file    Non-medical: Not on file  Tobacco Use  . Smoking status: Never Smoker  . Smokeless tobacco: Never Used  Substance and Sexual Activity  . Alcohol use: Yes    Comment: social  .  Drug use: No  . Sexual activity: Yes    Birth control/protection: IUD  Lifestyle  . Physical activity:    Days per week: Not on file    Minutes per session: Not on file  . Stress: Not on file  Relationships  . Social connections:    Talks on phone: Not on file    Gets together: Not on file    Attends religious service: Not on file    Active member of club or organization: Not on file    Attends meetings of clubs or organizations: Not on file    Relationship status: Not on file  .  Intimate partner violence:    Fear of current or ex partner: Not on file    Emotionally abused: Not on file    Physically abused: Not on file    Forced sexual activity: Not on file  Other Topics Concern  . Not on file  Social History Narrative  . Not on file    Allergies  Allergen Reactions  . Hydrocodone-Acetaminophen Nausea And Vomiting  . Amitriptyline Other (See Comments)  . Prednisone Other (See Comments)    Violent, insomnia    Outpatient Medications Prior to Visit  Medication Sig Dispense Refill  . ALPRAZolam (XANAX) 0.5 MG tablet Take 0.5 mg by mouth at bedtime as needed for anxiety. Dr Randel Books    . clonazePAM (KLONOPIN) 1 MG tablet Take 1 mg by mouth at bedtime. Dr Randel Books    . levonorgestrel (Wayne) 20 MCG/24HR IUD 1 each by Intrauterine route once.    . vitamin B-12 (CYANOCOBALAMIN) 1000 MCG tablet Take 1,000 mcg by mouth daily.    Marland Kitchen zolpidem (AMBIEN) 10 MG tablet Take 10 mg by mouth at bedtime as needed for sleep. Dr Randel Books    . methylphenidate (RITALIN) 10 MG tablet Take 1 tablet by mouth 3 (three) times daily. Dr Randel Books  0  . buPROPion (WELLBUTRIN XL) 150 MG 24 hr tablet Take 1 tablet (150 mg total) by mouth daily. 30 tablet 1   No facility-administered medications prior to visit.     Review of Systems  Constitutional: Negative for chills, diaphoresis, fatigue, fever, malaise/fatigue, weight gain and weight loss.  HENT: Negative for congestion, ear discharge, ear pain, hoarse voice and sore throat.   Eyes: Negative for blurred vision.  Respiratory: Negative for cough, sputum production, shortness of breath and wheezing.   Cardiovascular: Positive for chest pain and palpitations. Negative for leg swelling.  Gastrointestinal: Positive for nausea. Negative for abdominal pain, anorexia, blood in stool, change in bowel habit, constipation, diarrhea, heartburn, melena and vomiting.  Genitourinary: Negative for dysuria, frequency, hematuria, menstrual problem and  urgency.  Musculoskeletal: Negative for arthralgias, back pain, joint pain, joint swelling, myalgias and neck pain.  Skin: Negative for pallor and rash.  Neurological: Positive for dizziness and headaches. Negative for vertigo, tingling, tremors, sensory change, focal weakness, seizures, speech difficulty, weakness and numbness.  Endo/Heme/Allergies: Positive for cold intolerance. Negative for environmental allergies, heat intolerance and polydipsia. Does not bruise/bleed easily.  Psychiatric/Behavioral: Positive for confusion. Negative for depression and suicidal ideas. The patient is nervous/anxious. The patient does not have insomnia.      Objective  Vitals:   08/05/17 1056  BP: 100/60  Pulse: 72  Weight: 111 lb (50.3 kg)  Height: 5\' 1"  (1.549 m)    Physical Exam  Constitutional: No distress.  HENT:  Head: Normocephalic and atraumatic.  Right Ear: External ear normal.  Left Ear: External ear normal.  Nose:  Nose normal.  Mouth/Throat: Oropharynx is clear and moist.  Eyes: Pupils are equal, round, and reactive to light. Conjunctivae and EOM are normal. Right eye exhibits no discharge. Left eye exhibits no discharge.  Neck: Normal range of motion. Neck supple. No JVD present. No thyromegaly present.  Cardiovascular: Normal rate, regular rhythm, normal heart sounds and intact distal pulses. Exam reveals no gallop and no friction rub.  No murmur heard. Pulmonary/Chest: Effort normal and breath sounds normal. She has no wheezes. She has no rales.  Abdominal: Soft. Bowel sounds are normal. She exhibits no mass. There is no tenderness. There is no guarding.  Musculoskeletal: Normal range of motion. She exhibits no edema.  Lymphadenopathy:    She has no cervical adenopathy.  Neurological: She is alert. She has normal reflexes.  Skin: Skin is warm and dry. She is not diaphoretic.  Nursing note and vitals reviewed.     Assessment & Plan  Problem List Items Addressed This Visit       Other   Anxiety    Continue seeing Dr Randel Books at [facility]      Attention deficit disorder    Continue seeing Dr Randel Books at [place] in Rx for B12 injections      Relevant Medications   cyanocobalamin (,VITAMIN B-12,) 1000 MCG/ML injection    Other Visit Diagnoses    Establishing care with new doctor, encounter for    -  Primary   Malaise and fatigue       draw Thyroid panel and CBC    Relevant Orders   Thyroid Panel With TSH   CBC with Differential/Platelet   Renal Function Panel   Hemoglobin A1c   Gastroesophageal reflux disease without esophagitis       sent on Rx for Protonix   Relevant Medications   pantoprazole (PROTONIX) 40 MG tablet      Meds ordered this encounter  Medications  . pantoprazole (PROTONIX) 40 MG tablet    Sig: Take 1 tablet (40 mg total) by mouth daily.    Dispense:  90 tablet    Refill:  1  . cyanocobalamin (,VITAMIN B-12,) 1000 MCG/ML injection    Sig: Inject 1 mL (1,000 mcg total) into the muscle every 30 (thirty) days.    Dispense:  1 mL    Refill:  5   I spent 50 minutes with this patient, More than 50% of that time was spent in face to face education, counseling and care coordination.   Dr. Macon [facility]  08/05/17

## 2017-08-06 LAB — CBC WITH DIFFERENTIAL/PLATELET
BASOS: 0 %
Basophils Absolute: 0 10*3/uL (ref 0.0–0.2)
EOS (ABSOLUTE): 0 10*3/uL (ref 0.0–0.4)
EOS: 0 %
HEMATOCRIT: 41.4 % (ref 34.0–46.6)
HEMOGLOBIN: 14.2 g/dL (ref 11.1–15.9)
Immature Grans (Abs): 0 10*3/uL (ref 0.0–0.1)
Immature Granulocytes: 0 %
LYMPHS ABS: 2.1 10*3/uL (ref 0.7–3.1)
Lymphs: 30 %
MCH: 33.6 pg — ABNORMAL HIGH (ref 26.6–33.0)
MCHC: 34.3 g/dL (ref 31.5–35.7)
MCV: 98 fL — AB (ref 79–97)
MONOCYTES: 8 %
MONOS ABS: 0.6 10*3/uL (ref 0.1–0.9)
NEUTROS ABS: 4.2 10*3/uL (ref 1.4–7.0)
Neutrophils: 62 %
Platelets: 230 10*3/uL (ref 150–450)
RBC: 4.23 x10E6/uL (ref 3.77–5.28)
RDW: 13.3 % (ref 12.3–15.4)
WBC: 7 10*3/uL (ref 3.4–10.8)

## 2017-08-06 LAB — HEMOGLOBIN A1C
Est. average glucose Bld gHb Est-mCnc: 94 mg/dL
HEMOGLOBIN A1C: 4.9 % (ref 4.8–5.6)

## 2017-08-06 LAB — RENAL FUNCTION PANEL
ALBUMIN: 4.7 g/dL (ref 3.5–5.5)
BUN / CREAT RATIO: 18 (ref 9–23)
BUN: 12 mg/dL (ref 6–24)
CO2: 23 mmol/L (ref 20–29)
CREATININE: 0.67 mg/dL (ref 0.57–1.00)
Calcium: 9.2 mg/dL (ref 8.7–10.2)
Chloride: 106 mmol/L (ref 96–106)
GFR calc non Af Amer: 106 mL/min/{1.73_m2} (ref >59)
GFR, EST AFRICAN AMERICAN: 123 mL/min/{1.73_m2} (ref >59)
Glucose: 82 mg/dL (ref 65–99)
Phosphorus: 3.4 mg/dL (ref 2.5–4.5)
Potassium: 4.2 mmol/L (ref 3.5–5.2)
Sodium: 142 mmol/L (ref 134–144)

## 2017-08-06 LAB — THYROID PANEL WITH TSH
Free Thyroxine Index: 1.9 (ref 1.2–4.9)
T3 Uptake Ratio: 26 % (ref 24–39)
T4 TOTAL: 7.4 ug/dL (ref 4.5–12.0)
TSH: 1.62 u[IU]/mL (ref 0.450–4.500)

## 2017-08-11 ENCOUNTER — Other Ambulatory Visit: Payer: Self-pay

## 2017-08-11 ENCOUNTER — Telehealth: Payer: Self-pay

## 2017-08-11 DIAGNOSIS — R42 Dizziness and giddiness: Secondary | ICD-10-CM

## 2017-08-11 DIAGNOSIS — J029 Acute pharyngitis, unspecified: Secondary | ICD-10-CM

## 2017-08-11 MED ORDER — AZITHROMYCIN 250 MG PO TABS: ORAL_TABLET | ORAL | 0 refills | Status: DC

## 2017-08-11 NOTE — Telephone Encounter (Signed)
Send to ENT for dizziness and sore throat/ sent in [place] to [facility]

## 2017-08-25 DIAGNOSIS — F4310 Post-traumatic stress disorder, unspecified: Secondary | ICD-10-CM | POA: Diagnosis not present

## 2017-09-03 ENCOUNTER — Emergency Department
Admission: EM | Admit: 2017-09-03 | Discharge: 2017-09-03 | Disposition: A | Discharge status: Home / Self Care | Payer: 59 | Attending: Emergency Medicine | Admitting: Emergency Medicine

## 2017-09-03 ENCOUNTER — Encounter: Payer: Self-pay | Admitting: Emergency Medicine

## 2017-09-03 ENCOUNTER — Emergency Department: Payer: 59

## 2017-09-03 ENCOUNTER — Other Ambulatory Visit: Payer: Self-pay

## 2017-09-03 DIAGNOSIS — Z79899 Other long term (current) drug therapy: Secondary | ICD-10-CM | POA: Insufficient documentation

## 2017-09-03 DIAGNOSIS — R079 Chest pain, unspecified: Secondary | ICD-10-CM | POA: Diagnosis not present

## 2017-09-03 LAB — BASIC METABOLIC PANEL
ANION GAP: 10 (ref 5–15)
BUN: 15 mg/dL (ref 6–20)
CALCIUM: 9.3 mg/dL (ref 8.9–10.3)
CHLORIDE: 105 mmol/L (ref 98–111)
CO2: 23 mmol/L (ref 22–32)
Creatinine, Ser: 0.68 mg/dL (ref 0.44–1.00)
GFR calc non Af Amer: >60 mL/min (ref >60)
GLUCOSE: 108 mg/dL — AB (ref 70–99)
Potassium: 3.7 mmol/L (ref 3.5–5.1)
Sodium: 138 mmol/L (ref 135–145)

## 2017-09-03 LAB — CBC
HCT: 39.9 % (ref 35.0–47.0)
HEMOGLOBIN: 13.8 g/dL (ref 12.0–16.0)
MCH: 34.9 pg — AB (ref 26.0–34.0)
MCHC: 34.6 g/dL (ref 32.0–36.0)
MCV: 101 fL — AB (ref 80.0–100.0)
Platelets: 221 10*3/uL (ref 150–440)
RBC: 3.95 MIL/uL (ref 3.80–5.20)
RDW: 12.7 % (ref 11.5–14.5)
WBC: 8.7 10*3/uL (ref 3.6–11.0)

## 2017-09-03 LAB — TROPONIN I

## 2017-09-03 LAB — POCT PREGNANCY, URINE: PREG TEST UR: NEGATIVE

## 2017-09-03 LAB — FIBRIN DERIVATIVES D-DIMER (ARMC ONLY): Fibrin derivatives D-dimer (ARMC): 301.76 ng/mL (FEU) (ref 0.00–499.00)

## 2017-09-03 MED ORDER — GI COCKTAIL ~~LOC~~
30.0000 mL | Freq: Once | ORAL | Status: AC
  Administered 2017-09-03: 30 mL via ORAL
  Filled 2017-09-03: qty 30

## 2017-09-03 MED ORDER — NITROGLYCERIN 0.4 MG SL SUBL
SUBLINGUAL_TABLET | SUBLINGUAL | Status: AC
  Filled 2017-09-03: qty 1

## 2017-09-03 MED ORDER — NITROGLYCERIN 0.4 MG SL SUBL
0.4000 mg | SUBLINGUAL_TABLET | Freq: Once | SUBLINGUAL | Status: AC
Start: 2017-09-03 — End: 2017-09-03
  Administered 2017-09-03: 0.4 mg via SUBLINGUAL

## 2017-09-03 MED ORDER — NITROGLYCERIN 0.4 MG SL SUBL: 0.4000 mg | SUBLINGUAL_TABLET | SUBLINGUAL | 0 refills | Status: DC | PRN

## 2017-09-03 NOTE — ED Provider Notes (Signed)
[facility] Emergency Department Provider Note   ____________________________________________   I have reviewed the triage vital signs and the nursing notes.   HISTORY  Chief Complaint Chest Pain   History limited by: Not Limited   HPI Jaime Fletcher is a 47 y.o. female who presents to the emergency department today because of concerns for chest pain.  Symptoms started today.  Patient was at work when the symptoms started.  Located in the center and left chest.  There is some radiation to her back.  She did have some diaphoresis with this.  Denies ever having similar pain in the past.  Does have a history of acid reflux but states this is not remind her of that.  States family history of blood clots.    Per medical record review patient has a history of PTSD, anxiety.  Past Medical History:  Diagnosis Date  . Abnormal Pap smear of cervix    dysplasia  . ADD (attention deficit disorder)   . PTSD (post-traumatic stress disorder)     Patient Active Problem List   Diagnosis Date Noted  . Depression 08/05/2017  . Abnormal Pap smear of cervix 11/20/2016  . Anxiety 11/20/2016  . Fibromyalgia 11/20/2016  . Encounter for insertion of intrauterine contraceptive device (IUD) 11/20/2016  . B12 deficiency 06/19/2015  . Vitamin D deficiency, unspecified 06/19/2015  . Vitamin D deficiency 06/19/2015  . Myopic astigmatism of both eyes 08/10/2014  . Palpitations 05/19/2013  . Attention deficit disorder 08/17/2010  . Dysthymic disorder 08/17/2010  . Obsessive-compulsive disorder 08/17/2010  . Posttraumatic stress disorder 08/17/2010    Past Surgical History:  Procedure Laterality Date  . APPENDECTOMY    . cold knife bx    . DILATION AND CURETTAGE OF UTERUS    . SPINE SURGERY    . TONSILLECTOMY      Prior to Admission medications   Medication Sig Start Date End Date Taking? Authorizing Provider  ALPRAZolam Duanne Moron) 0.5 MG tablet Take 0.5 mg by mouth at  bedtime as needed for anxiety. Dr Randel Books    [provider]  azithromycin (ZITHROMAX) 250 MG tablet Use as directed 08/11/17   Juline Patch, MD  clonazePAM (KLONOPIN) 1 MG tablet Take 1 mg by mouth at bedtime. Dr Randel Books    [provider]  cyanocobalamin (,VITAMIN B-12,) 1000 MCG/ML injection Inject 1 mL (1,000 mcg total) into the muscle every 30 (thirty) days. 08/05/17   Juline Patch, MD  levonorgestrel (MIRENA) 20 MCG/24HR IUD 1 each by Intrauterine route once.    [provider]  methylphenidate (RITALIN) 10 MG tablet Take 1 tablet by mouth 3 (three) times daily. Dr Randel Books 07/08/17   [provider]  pantoprazole (PROTONIX) 40 MG tablet Take 1 tablet (40 mg total) by mouth daily. 08/05/17   Juline Patch, MD  vitamin B-12 (CYANOCOBALAMIN) 1000 MCG tablet Take 1,000 mcg by mouth daily.    [provider]  zolpidem (AMBIEN) 10 MG tablet Take 10 mg by mouth at bedtime as needed for sleep. Dr Randel Books    [provider]    Allergies Hydrocodone-acetaminophen; Prednisone; and Amitriptyline  Family History  Problem Relation Age of Onset  . Diabetes Mother   . Breast cancer Paternal Aunt   . Heart failure Maternal Grandmother   . Ovarian cancer Neg Hx   . Colon cancer Neg Hx     Social History Social History   Tobacco Use  . Smoking status: Never Smoker  .  Smokeless tobacco: Never Used  Substance Use Topics  . Alcohol use: Yes    Comment: social  . Drug use: No    Review of Systems Constitutional: No fever/chills Eyes: No visual changes. ENT: No sore throat. Cardiovascular: Positive for chest pain. Respiratory: Denies shortness of breath. Gastrointestinal: No abdominal pain.  No nausea, no vomiting.  No diarrhea.   Genitourinary: Negative for dysuria. Musculoskeletal: Negative for back pain. Skin: Negative for rash. Neurological: Negative for headaches, focal weakness or  numbness.  ____________________________________________   PHYSICAL EXAM:  VITAL SIGNS: ED Triage Vitals  Enc Vitals Group     BP 09/03/17 1734 (!) 144/66     Pulse Rate 09/03/17 1734 90     Resp 09/03/17 1734 16     Temp 09/03/17 1734 98.1 F (36.7 C)     Temp Source 09/03/17 1734 Oral     SpO2 09/03/17 1734 97 %     Weight 09/03/17 1735 112 lb (50.8 kg)     Height 09/03/17 1735 5\' 1"  (1.549 m)     Head Circumference --      Peak Flow --      Pain Score 09/03/17 1735 6   Constitutional: Alert and oriented.  Eyes: Conjunctivae are normal.  ENT      Head: Normocephalic and atraumatic.      Nose: No congestion/rhinnorhea.      Mouth/Throat: Mucous membranes are moist.      Neck: No stridor. Hematological/Lymphatic/Immunilogical: No cervical lymphadenopathy. Cardiovascular: Normal rate, regular rhythm.  No murmurs, rubs, or gallops.  Respiratory: Normal respiratory effort without tachypnea nor retractions. Breath sounds are clear and equal bilaterally. No wheezes/rales/rhonchi. Gastrointestinal: Soft and non tender. No rebound. No guarding.  Genitourinary: Deferred Musculoskeletal: Normal range of motion in all extremities. No lower extremity edema. Neurologic:  Normal speech and language. No gross focal neurologic deficits are appreciated.  Skin:  Skin is warm, dry and intact. No rash noted. Psychiatric: Mood and affect are normal. Speech and behavior are normal. Patient exhibits appropriate insight and judgment.  ____________________________________________    LABS (pertinent positives/negatives)  CBC wbc 8.7, hgb 13.8, plt 221 BMP wnl except glu 108 Trop <0.03 x 2 D-dimer 301.76 ____________________________________________   EKG  I, Nance Pear, attending physician, personally viewed and interpreted this EKG  EKG Time: 1729 Rate: 82 Rhythm: normal sinus rhythm Axis: normal Intervals: qtc 448 QRS: narrow, q waves v1 ST changes: no st  elevation Impression: left atrial enlargement, abnormal ekg  ____________________________________________    RADIOLOGY  CXR No acute disease  ____________________________________________   PROCEDURES  Procedures  ____________________________________________   INITIAL IMPRESSION / ASSESSMENT AND PLAN / ED COURSE  Pertinent labs & imaging results that were available during my care of the patient were reviewed by me and considered in my medical decision making (see chart for details).   Patient presented to the emergency department today because of concerns for chest pain.  Differential would be broad including pneumonia, PE, dissection, pneumothorax, ACS, esophagitis amongst other etiologies.  Troponins negative x2.  Chest x-ray without concerning findings.  D-dimer below 500.  Patient did get some relief after nitroglycerin.  Discussed with patient's importance of following up with primary care and cardiology.    ____________________________________________   FINAL CLINICAL IMPRESSION(S) / ED DIAGNOSES  Final diagnoses:  Nonspecific chest pain     Note: This dictation was prepared with Dragon dictation. Any transcriptional errors that result from this process are unintentional     Nance Pear, MD  09/03/17 2131  

## 2017-09-03 NOTE — Discharge Instructions (Addendum)
Please seek medical attention for any high fevers, chest pain, shortness of breath, change in behavior, persistent vomiting, bloody stool or any other new or concerning symptoms.  

## 2017-09-03 NOTE — ED Triage Notes (Signed)
Pt in via POV from work, reports sudden onset left side chest pain with radiation through to back, reports associated light headedness, dizziness.  Vitals WDL, NAD noted at this time.

## 2017-09-03 NOTE — ED Notes (Signed)
Pt and visitor given warm blankets. °

## 2017-09-09 ENCOUNTER — Ambulatory Visit (INDEPENDENT_AMBULATORY_CARE_PROVIDER_SITE_OTHER): Payer: 59 | Admitting: Cardiovascular Disease

## 2017-09-09 ENCOUNTER — Ambulatory Visit (INDEPENDENT_AMBULATORY_CARE_PROVIDER_SITE_OTHER): Payer: 59

## 2017-09-09 ENCOUNTER — Encounter: Payer: Self-pay | Admitting: Cardiovascular Disease

## 2017-09-09 ENCOUNTER — Encounter

## 2017-09-09 VITALS — BP 98/63 | HR 70 | Ht 65.0 in | Wt 111.8 lb

## 2017-09-09 DIAGNOSIS — R002 Palpitations: Secondary | ICD-10-CM

## 2017-09-09 DIAGNOSIS — F419 Anxiety disorder, unspecified: Secondary | ICD-10-CM

## 2017-09-09 DIAGNOSIS — I479 Paroxysmal tachycardia, unspecified: Secondary | ICD-10-CM | POA: Insufficient documentation

## 2017-09-09 DIAGNOSIS — F5101 Primary insomnia: Secondary | ICD-10-CM

## 2017-09-09 DIAGNOSIS — R079 Chest pain, unspecified: Secondary | ICD-10-CM

## 2017-09-09 DIAGNOSIS — G4700 Insomnia, unspecified: Secondary | ICD-10-CM | POA: Insufficient documentation

## 2017-09-09 MED ORDER — PROPRANOLOL HCL 10 MG PO TABS: 10.0000 mg | ORAL_TABLET | Freq: Three times a day (TID) | ORAL | 3 refills | Status: DC | PRN

## 2017-09-09 NOTE — Progress Notes (Signed)
Cardiology Office Note  Date:  09/09/2017   ID:  Jaime Fletcher, DOB 1970-04-06, MRN [id number]  PCP:  Juline Patch, MD   Chief Complaint  Patient presents with  . other     chest pain c/o irregular heart beat this am and fatigue. Meds reviewed verbally with pt.    HPI:  [name] is a 72 short woman with past medical history of PTSD ADD OCD Who presents by referral from Otilio Miu for consultation of her tachycardia and chest pain  Reports having frequent episodes of tachycardia Also with frequent ectopy Feels like a "piece of meat flopping over" Last night with tachycardia heart rate up to 145 bpm, woke her from sleep  Went to the emergency room 09/03/2017 with episode of chest pain Reports that symptoms were better on nitroglycerin in the emergency room  She has had issues with headaches Chronic insomnia She is on Ambien prescribed by her psychologist Reports that she takes 5 mg Ambien before bed and additional 5 mg 3 hours later at 2 AM when she wakes up This does not seem to be working very well  Otherwise active at baseline with no exertional chest discomfort  EKG personally reviewed by myself on todays visit Shows normal sinus rhythm with rate 70 bpm no significant ST or T-wave changes   PMH:   has a past medical history of Abnormal Pap smear of cervix, ADD (attention deficit disorder), and PTSD (post-traumatic stress disorder).  PSH:    Past Surgical History:  Procedure Laterality Date  . APPENDECTOMY    . cold knife bx    . DILATION AND CURETTAGE OF UTERUS    . SPINE SURGERY    . TONSILLECTOMY      Current Outpatient Medications  Medication Sig Dispense Refill  . ALPRAZolam (XANAX) 0.5 MG tablet Take 0.5 mg by mouth at bedtime as needed for anxiety. Dr Randel Books    . clonazePAM (KLONOPIN) 1 MG tablet Take 1 mg by mouth as needed. Dr Randel Books    . cyanocobalamin (,VITAMIN B-12,) 1000 MCG/ML injection Inject 1 mL (1,000 mcg total) into the  muscle every 30 (thirty) days. 1 mL 5  . levonorgestrel (MIRENA) 20 MCG/24HR IUD 1 each by Intrauterine route once.    . methylphenidate (RITALIN) 10 MG tablet Take 1 tablet by mouth 3 (three) times daily as needed. Dr Randel Books  0  . nitroGLYCERIN (NITROSTAT) 0.4 MG SL tablet Place 1 tablet (0.4 mg total) under the tongue every 5 (five) minutes as needed for chest pain. 30 tablet 0  . pantoprazole (PROTONIX) 40 MG tablet Take 1 tablet (40 mg total) by mouth daily. 90 tablet 1  . zolpidem (AMBIEN) 10 MG tablet Take 10 mg by mouth at bedtime as needed for sleep. Dr Randel Books    . propranolol (INDERAL) 10 MG tablet Take 1 tablet (10 mg total) by mouth 3 (three) times daily as needed. 90 tablet 3   No current facility-administered medications for this visit.      Allergies:   Hydrocodone-acetaminophen; Prednisone; and Amitriptyline   Social History:  The patient  reports that she has never smoked. She has never used smokeless tobacco. She reports that she drinks alcohol. She reports that she does not use drugs.   Family History:   family history includes Atrial fibrillation in her father; Breast cancer in her paternal aunt; Diabetes in her mother; Heart attack in her mother; Heart disease in her mother; Heart failure in her maternal grandmother;  Hypertension in her father and mother.    Review of Systems: Review of Systems  Constitutional: Negative.   Respiratory: Negative.   Cardiovascular: Positive for chest pain and palpitations.       Tachycardia  Gastrointestinal: Negative.   Musculoskeletal: Negative.   Neurological: Negative.   Psychiatric/Behavioral: Negative.   All other systems reviewed and are negative.    PHYSICAL EXAM: VS:  BP 98/63 (BP Location: Right Arm, Patient Position: Sitting, Cuff Size: Normal)   Pulse 70   Ht 5\' 5"  (1.651 m)   Wt 111 lb 12 oz (50.7 kg)   BMI 18.60 kg/m  , BMI Body mass index is 18.6 kg/m. GEN: Well nourished, well developed, in no acute  distress  HEENT: normal  Neck: no JVD, carotid bruits, or masses Cardiac: RRR; no murmurs, rubs, or gallops,no edema  Respiratory:  clear to auscultation bilaterally, normal work of breathing GI: soft, nontender, nondistended, + BS MS: no deformity or atrophy  Skin: warm and dry, no rash Neuro:  Strength and sensation are intact Psych: euthymic mood, full affect    Recent Labs: 09/17/2016: ALT 17 08/05/2017: TSH 1.620 09/03/2017: BUN 15; Creatinine, Ser 0.68; Hemoglobin 13.8; Platelets 221; Potassium 3.7; Sodium 138    Lipid Panel No results found for: CHOL, HDL, LDLCALC, TRIG    Wt Readings from Last 3 Encounters:  09/09/17 111 lb 12 oz (50.7 kg)  09/03/17 112 lb (50.8 kg)  08/05/17 111 lb (50.3 kg)       ASSESSMENT AND PLAN:  Palpitations - Plan: EKG 12-Lead Likely having APCs or PVCs We have placed long-term monitor, Zio Patch on her visit today These can be treated with low-dose beta blockers as needed  Anxiety Managed by psychiatry Problems with insomnia likely exacerbating her symptoms  Paroxysmal tachycardia ([place]) Unable to exclude atrial tachycardia as a cause of her heart rate in the 140 range last night Monitor has been placed on her today Recommended she take propranolol 10 up to 20 mgrams as needed for episodes of tachycardia  Chest pain, unspecified type Atypical in nature, possibly exacerbated by stress or anxiety Unable to exclude coronary spasm She does have nitroglycerin at home which she can take as needed  Primary insomnia Treated with Ambien by psychiatry Reports it does not work well takes 5 mg twice a night   Disposition:   We have recommended that we call her with the results of her event monitor   Total encounter time more than 60 minutes  Greater than 50% was spent in counseling and coordination of care with the patient  Patient was seen in consultation for Otilio Miu will be referred back to her office for ongoing care of the  issues detailed above       Orders Placed This Encounter  Procedures  . EKG 12-Lead     Signed, Esmond Plants, M.D., Ph.D. 09/09/2017  [place], [place]

## 2017-09-09 NOTE — Patient Instructions (Addendum)
Medication Instructions:   Please take propranolol 10 mg as needed for tachycardia Every 6 hours  Labwork:  No new labs needed  Testing/Procedures:  We will place a ZIO monitor today for atrial tachycardia   Follow-Up: It was a pleasure seeing you in the office today. Please call us if you have new issues that need to be addressed before your next appt.  [phone]  Your physician wants you to follow-up in:  We will call you with the results  If you need a refill on your cardiac medications before your next appointment, please call your pharmacy.  For educational health videos Log in to : [email or web address] Or : [email or web address], password : [email or web address]

## 2017-09-10 ENCOUNTER — Telehealth: Payer: Self-pay | Admitting: Cardiovascular Disease

## 2017-09-10 NOTE — Telephone Encounter (Signed)
Left voicemail message to call back  

## 2017-09-10 NOTE — Telephone Encounter (Signed)
Patient took BP last night 100/50 hasnt had a chance to recheck today.  HR was 160 this morning. Now currently 66  Patient wants to see if she should take or not take the propanolol with her bp this low.  Please call to discuss concerns and parameters for taking the medicine .

## 2017-09-11 NOTE — Telephone Encounter (Signed)
I spoke with the patient. She states that her HR has been variable.  She can feel like she is running fast and she is in the 80-90 range (but feeling the arrhythmia), or she can be 111-120 bpm.  Her concern is when to take the propranolol in regards to her BP. She states her BP typically runs in the low-mid 16'X systolic. I advised her we would typically have her take it if her SBP is 100 or above. However, if she runs below 100, I will need to clarify with Dr. Rockey Situ what parameters he would like to have for her BP as to when she should take propranolol.  She states she is sensitive to medications in general and she has never taken a beta blocker. I have advised her that until she hears back from [place], if she has an episode of fast HR or palpitations that do not resolve within minutes and her SBP is 100 or greater, to just taking the propranolol at a 5 mg dose. She is agreeable and voices understanding.

## 2017-09-12 NOTE — Telephone Encounter (Signed)
I called and spoke with the patient and advised her of the parameters Dr. Rockey Situ has recommended for her in using her propranolol.  She voices understanding and is agreeable. She states she did take a 1/4 of a tablet (~2.5 mg) of propranolol last night after she got home from work to see if this would help the palpitations she was having although her rates were not fast.  She did find some relief with this. She states she was awoken from sleep around 4:00 am this morning with her HR in the 140's. She did not take any propranolol at that time.   I have advised the patient to use the propranolol if needed, but to be aware of where her BP is when taking if possible. The patient is pushing the button on her ZIO to record symptoms. She is aware to send this back right after her 14 days is up so the report can be uploaded for Dr. Rockey Situ. She verbalizes understanding of all of the above and is agreeable.

## 2017-09-12 NOTE — Telephone Encounter (Signed)
Late entry- I spoke with Dr. Rockey Situ ~ 5:15 pm yesterday to establish parameters for when the patient should be using her PRN propranolol.  Per Dr. Rockey Situ:  - If HR >/= 110 bpm, even if SBP < 100, take 10 mg of propranolol every 6 hours as needed. (per Dr. Rockey Situ, the PRN propranolol is mainly to treat her tachycardia) - If HR, 110 bpm, but the patient is having palpitations, she may try propranolol 5 mg every 6 hours as needed if SBP >/= 100.   - will need to await ZIO monitor results to identify what the patient is experiencing.   I attempted to call the patient last night and left a her a message to please call this morning to discuss further.

## 2017-10-06 DIAGNOSIS — R002 Palpitations: Secondary | ICD-10-CM | POA: Diagnosis not present

## 2017-10-07 ENCOUNTER — Other Ambulatory Visit: Payer: Self-pay | Admitting: *Deleted

## 2017-10-07 ENCOUNTER — Ambulatory Visit: Payer: Self-pay | Admitting: Cardiovascular Disease

## 2017-10-07 DIAGNOSIS — R002 Palpitations: Secondary | ICD-10-CM

## 2017-10-10 ENCOUNTER — Ambulatory Visit: Payer: Self-pay | Admitting: Physician Assistant

## 2017-10-10 ENCOUNTER — Encounter: Payer: Self-pay | Admitting: Physician Assistant

## 2017-10-10 VITALS — BP 110/70 | HR 91 | Temp 98.3°F | Wt 117.0 lb

## 2017-10-10 DIAGNOSIS — J029 Acute pharyngitis, unspecified: Secondary | ICD-10-CM

## 2017-10-10 DIAGNOSIS — B349 Viral infection, unspecified: Secondary | ICD-10-CM

## 2017-10-10 LAB — POCT RAPID STREP A (OFFICE): Rapid Strep A Screen: NEGATIVE

## 2017-10-10 MED ORDER — VALACYCLOVIR HCL 1 G PO TABS
1000.0000 mg | ORAL_TABLET | Freq: Two times a day (BID) | ORAL | 0 refills | Status: DC
Start: 2017-10-10 — End: 2018-09-30

## 2017-10-10 MED ORDER — NAPROXEN 500 MG PO TABS: 500.0000 mg | ORAL_TABLET | Freq: Two times a day (BID) | ORAL | 0 refills | Status: DC

## 2017-10-10 MED ORDER — LIDOCAINE VISCOUS HCL 2 % MT SOLN: 5.0000 mL | OROMUCOSAL | 0 refills | Status: DC | PRN

## 2017-10-10 NOTE — Progress Notes (Signed)
10/10/2017 9:53 AM   DOB: Feb 11, 1971 / MRN: [id number]  SUBJECTIVE:  Jaime Fletcher is a 47 y.o. female presenting for sore throat, nasal congestion, mild cough productive green sputum. Symptoms present for 3 days.  The problem is getting worse. She has tried several OTC preps without relief.   She is allergic to hydrocodone-acetaminophen; prednisone; and amitriptyline.   She  has a past medical history of Abnormal Pap smear of cervix, ADD (attention deficit disorder), and PTSD (post-traumatic stress disorder).    She  reports that she has never smoked. She has never used smokeless tobacco. She reports that she drinks alcohol. She reports that she does not use drugs. She  reports that she currently engages in sexual activity. She reports using the following method of birth control/protection: IUD. The patient  has a past surgical history that includes Appendectomy; Tonsillectomy; Spine surgery; Dilation and curettage of uterus; and cold knife bx.  Her family history includes Atrial fibrillation in her father; Breast cancer in her paternal aunt; Diabetes in her mother; Heart attack in her mother; Heart disease in her mother; Heart failure in her maternal grandmother; Hypertension in her father and mother.  Review of Systems  Constitutional: Negative for chills, diaphoresis and fever.  HENT: Positive for sinus pain and sore throat.   Respiratory: Positive for cough and sputum production. Negative for hemoptysis, shortness of breath and wheezing.   Cardiovascular: Negative for chest pain, orthopnea and leg swelling.  Gastrointestinal: Negative for nausea.  Skin: Negative for rash.  Neurological: Negative for dizziness.    The problem list and medications were reviewed and updated by myself where necessary and exist elsewhere in the encounter.   OBJECTIVE:  BP 110/70   Pulse 91   Temp 98.3 F (36.8 C)   Wt 117 lb (53.1 kg)   SpO2 99%   BMI 19.47 kg/m   Wt Readings from Last 3  Encounters:  10/10/17 117 lb (53.1 kg)  09/09/17 111 lb 12 oz (50.7 kg)  09/03/17 112 lb (50.8 kg)   Temp Readings from Last 3 Encounters:  10/10/17 98.3 F (36.8 C)  09/03/17 98.1 F (36.7 C) (Oral)  09/17/16 98.6 F (37 C) (Oral)   BP Readings from Last 3 Encounters:  10/10/17 110/70  09/09/17 98/63  09/03/17 (!) 108/53   Pulse Readings from Last 3 Encounters:  10/10/17 91  09/09/17 70  09/03/17 (!) 57    Physical Exam  Constitutional: She is oriented to person, place, and time. She appears well-developed and well-nourished. No distress.  HENT:  Right Ear: Hearing and tympanic membrane normal.  Left Ear: Hearing and tympanic membrane normal.  Nose: Mucosal edema present.  Mouth/Throat: Uvula is midline.    Eyes: Pupils are equal, round, and reactive to light. EOM are normal.  Cardiovascular: Normal rate.  Pulmonary/Chest: Effort normal.  Abdominal: She exhibits no distension.  Musculoskeletal: Normal range of motion.  Neurological: She is alert and oriented to person, place, and time. No cranial nerve deficit. Gait normal.  Skin: Skin is warm and dry. She is not diaphoretic.  Psychiatric: She has a normal mood and affect.  Vitals reviewed.   Lab Results  Component Value Date   HGBA1C 4.9 08/05/2017    Lab Results  Component Value Date   WBC 8.7 09/03/2017   HGB 13.8 09/03/2017   HCT 39.9 09/03/2017   MCV 101.0 (H) 09/03/2017   PLT 221 09/03/2017    Lab Results  Component Value Date  CREATININE 0.68 09/03/2017   BUN 15 09/03/2017   NA 138 09/03/2017   K 3.7 09/03/2017   CL 105 09/03/2017   CO2 23 09/03/2017    Lab Results  Component Value Date   ALT 17 09/17/2016   AST 19 09/17/2016   ALKPHOS 41 09/17/2016   BILITOT 0.8 09/17/2016    Lab Results  Component Value Date   TSH 1.620 08/05/2017    No results found for: CHOL, HDL, LDLCALC, LDLDIRECT, TRIG, CHOLHDL   ASSESSMENT AND PLAN:  Diagnoses and all orders for this visit:  Sore  throat -     POCT rapid strep A    The patient is advised to call or return to clinic if she does not see an improvement in symptoms, or to seek the care of the closest emergency department if she worsens with the above plan.   Philis Fendt, MHS, PA-C [facility] 10/10/2017 9:53 AM

## 2017-10-10 NOTE — Patient Instructions (Signed)
Get lots of rest and drink lots of fluids. Take medication as prescribed.  Stay away from babies.

## 2017-10-14 ENCOUNTER — Telehealth: Payer: Self-pay | Admitting: Emergency Medicine

## 2017-10-14 NOTE — Telephone Encounter (Signed)
Spoke with patient whom informed me that she is doing so much better. This was a follow call from visit with [facility]

## 2017-10-28 ENCOUNTER — Encounter: Payer: 59 | Admitting: Obstetrics and Gynecology

## 2017-11-13 ENCOUNTER — Encounter: Payer: 59 | Admitting: Obstetrics and Gynecology

## 2017-11-18 ENCOUNTER — Telehealth: Payer: Self-pay | Admitting: Obstetrics and Gynecology

## 2017-11-18 NOTE — Telephone Encounter (Signed)
lmtrc

## 2017-11-18 NOTE — Telephone Encounter (Signed)
The patient is asking for Crystal to call her today if possible about a personal situation and help her decide if she needs to come in sooner than her Nov 6 appt, please advise, thanks.

## 2017-11-20 NOTE — Telephone Encounter (Signed)
LMTRC

## 2017-11-24 DIAGNOSIS — F4310 Post-traumatic stress disorder, unspecified: Secondary | ICD-10-CM | POA: Diagnosis not present

## 2017-11-24 NOTE — Telephone Encounter (Signed)
Pt states she had heavy bleeding and painful cramps last week. She was unable to get an appointment. She took ibup and tylenol with no relief.  Today pt is feeling better. Advised pt if sx return to send my chart message and we will get her in sooner. Pt voices understanding.

## 2017-11-25 ENCOUNTER — Encounter: Payer: 59 | Admitting: Obstetrics and Gynecology

## 2017-12-05 NOTE — Progress Notes (Signed)
ANNUAL PREVENTATIVE CARE GYN  ENCOUNTER NOTE  Subjective:       Jaime Fletcher is a 47 y.o. [id number] female here for a routine annual gynecologic exam.  Current complaints:  1. aub x 2 months, pos cramps; long history of Mirena IUD use x4 with no abnormal uterine bleeding.  Previously used Nexplanon with no abnormal uterine bleeding.  Initial episode of bleeding was 2 months ago when she had pelvic pressure and a large clot expressed from the vagina; does not recall any foreign body within the clot. 2. History of cervical CIS - Cold knife conization - 1996  3. Vaginal changes-painful/ dryness 4. Wants iud replaced; IUD was inserted approximately 4 years ago.  Currently in a long-term relationship x5 years; just got engaged this year. Patient reports hot flashes and vaginal dryness.  History of cervical CIS, getting PAPs yearly. Endorses one abnormal result after cone knife conization, repeat three months later was normal.    Gynecologic History No LMP recorded. (Menstrual status: IUD). Contraception: IUD Last Pap: 11/20/2016 neg/neg. Results were: normal Last mammogram: 05/2015 wnl. Results were: normal  Obstetric History OB History  Gravida Para Term Preterm AB Living  4 3 2 1 1 3   SAB TAB Ectopic Multiple Live Births  1     1 3     # Outcome Date GA Lbr Len/2nd Weight Sex Delivery Anes PTL Lv  4 Term 2003   6 lb 14.4 oz (3.13 kg) M Vag-Spont   LIV  3 SAB 1996          2A Term 1991   5 lb 14.4 oz (2.676 kg) M Vag-Spont   LIV  2B Term 1991   4 lb (1.814 kg) F Vag-Spont   LIV  1 Preterm             Past Medical History:  Diagnosis Date  . Abnormal Pap smear of cervix    dysplasia  . ADD (attention deficit disorder)   . PTSD (post-traumatic stress disorder)     Past Surgical History:  Procedure Laterality Date  . APPENDECTOMY    . cold knife bx    . DILATION AND CURETTAGE OF UTERUS    . SPINE SURGERY    . TONSILLECTOMY      Current Outpatient Medications on File  Prior to Visit  Medication Sig Dispense Refill  . ALPRAZolam (XANAX) 0.5 MG tablet Take 0.5 mg by mouth at bedtime as needed for anxiety. Dr Randel Books    . clonazePAM (KLONOPIN) 1 MG tablet Take 1 mg by mouth as needed. Dr Randel Books    . cyanocobalamin (,VITAMIN B-12,) 1000 MCG/ML injection Inject 1 mL (1,000 mcg total) into the muscle every 30 (thirty) days. 1 mL 5  . fluticasone (FLONASE) 50 MCG/ACT nasal spray INSTILL 2 SPRAYS INTO EACH NOSTRIL DAILYAT BEDTIME    . levonorgestrel (MIRENA) 20 MCG/24HR IUD 1 each by Intrauterine route once.    . lidocaine (XYLOCAINE) 2 % solution Use as directed 5-10 mLs in the mouth or throat as needed for mouth pain. 200 mL 0  . methylphenidate (RITALIN) 10 MG tablet Take 1 tablet by mouth 3 (three) times daily as needed. Dr Randel Books  0  . naproxen (NAPROSYN) 500 MG tablet Take 1 tablet (500 mg total) by mouth 2 (two) times daily with a meal. 30 tablet 0  . nitroGLYCERIN (NITROSTAT) 0.4 MG SL tablet Place 1 tablet (0.4 mg total) under the tongue every 5 (five) minutes as needed for chest pain.  30 tablet 0  . pantoprazole (PROTONIX) 40 MG tablet Take 1 tablet (40 mg total) by mouth daily. 90 tablet 1  . propranolol (INDERAL) 10 MG tablet Take 1 tablet (10 mg total) by mouth 3 (three) times daily as needed. (Patient not taking: Reported on 10/10/2017) 90 tablet 3  . valACYclovir (VALTREX) 1000 MG tablet Take 1 tablet (1,000 mg total) by mouth 2 (two) times daily. 20 tablet 0  . zolpidem (AMBIEN) 10 MG tablet Take 10 mg by mouth at bedtime as needed for sleep. Dr Randel Books     No current facility-administered medications on file prior to visit.     Allergies  Allergen Reactions  . Hydrocodone-Acetaminophen Nausea And Vomiting  . Prednisone Other (See Comments)    Insomnia, violence  . Amitriptyline Other (See Comments)    Tremors     Social History   Socioeconomic History  . Marital status: Divorced    Spouse name: Not on file  . Number of children: Not on  file  . Years of education: Not on file  . Highest education level: Not on file  Occupational History  . Not on file  Social Needs  . Financial resource strain: Not on file  . Food insecurity:    Worry: Not on file    Inability: Not on file  . Transportation needs:    Medical: Not on file    Non-medical: Not on file  Tobacco Use  . Smoking status: Never Smoker  . Smokeless tobacco: Never Used  Substance and Sexual Activity  . Alcohol use: Yes    Comment: social  . Drug use: No  . Sexual activity: Yes    Birth control/protection: IUD  Lifestyle  . Physical activity:    Days per week: Not on file    Minutes per session: Not on file  . Stress: Not on file  Relationships  . Social connections:    Talks on phone: Not on file    Gets together: Not on file    Attends religious service: Not on file    Active member of club or organization: Not on file    Attends meetings of clubs or organizations: Not on file    Relationship status: Not on file  . Intimate partner violence:    Fear of current or ex partner: Not on file    Emotionally abused: Not on file    Physically abused: Not on file    Forced sexual activity: Not on file  Other Topics Concern  . Not on file  Social History Narrative  . Not on file    Family History  Problem Relation Age of Onset  . Diabetes Mother   . Heart attack Mother        x2  . Heart disease Mother   . Hypertension Mother   . Breast cancer Paternal Aunt   . Heart failure Maternal Grandmother   . Atrial fibrillation Father   . Hypertension Father   . Ovarian cancer Neg Hx   . Colon cancer Neg Hx     The following portions of the patient's history were reviewed and updated as appropriate: allergies, current medications, past family history, past medical history, past social history, past surgical history and problem list.  Review of Systems  Review of Systems  Constitutional: Negative for chills, diaphoresis, fever, malaise/fatigue and  weight loss.  Respiratory: Negative.   Cardiovascular: Negative.   Gastrointestinal:       Occasional cramping associated with vaginal spotting  Genitourinary: Negative for dysuria, flank pain, frequency and urgency.  Musculoskeletal: Negative.   Skin: Negative.   Neurological: Negative.   Endo/Heme/Allergies: Negative.   Psychiatric/Behavioral: Negative.     Objective:  BP 95/60   Pulse 75   Ht 5\' 5"  (1.651 m)   Wt 120 lb 1.6 oz (54.5 kg)   LMP 11/11/2017   BMI 19.99 kg/m  CONSTITUTIONAL: Well-developed, well-nourished female in no acute distress.  PSYCHIATRIC: Normal mood and affect. Normal behavior. Normal judgment and thought content. [place]: Alert and oriented to person, place, and time. Normal muscle tone coordination. No cranial nerve deficit noted. HENT:  Normocephalic, atraumatic, External right and left ear normal.  EYES: Conjunctivae and EOM are normal.  NECK: Normal range of motion, supple, no masses.  Normal thyroid.  SKIN: Skin is warm and dry. No rash noted. Not diaphoretic. No erythema. No pallor. CARDIOVASCULAR: Normal heart rate noted, regular rhythm, no murmur. RESPIRATORY: Clear to auscultation bilaterally. Effort and breath sounds normal, no problems with respiration noted. BREASTS: Symmetric in size. No masses, skin changes, nipple drainage, or lymphadenopathy.  Breast implants noted. ABDOMEN: Soft, normal bowel sounds, no distention noted.  No tenderness, rebound or guarding.  BLADDER: Normal-nontender PELVIC:  External Genitalia: Normal  BUS: Normal  Vagina: Normal estrogen effect; no discharge  Cervix: Normal, no cervical motion tenderness; IUD strings not visualized despite use of Q-tip and Cytobrush  Uterus: anteverted, 8 weeks size, mobile, nontender  Adnexa: Normal; nonpalpable nontender  RV: External Exam NormaI, No Rectal Masses and Normal Sphincter tone  MUSCULOSKELETAL: Normal range of motion. No tenderness.  No cyanosis, clubbing, or  edema.  2+ distal pulses. LYMPHATIC: No Axillary, Supraclavicular, or Inguinal Adenopathy.   Assessment:   Annual gynecologic examination 47 y.o. Contraception: IUD Normal BMI Lost IUD strings Abnormal uterine bleeding Vasomotor symptoms  Plan:  Pap: Due 2021 Mammogram: Ordered Stool [place]ai[place] Testing:  Not Indicated Labs: thru pcp.  [place] is ordered today. Pelvic ultrasound today to assess for lost IUD strings. 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  I have reviewed, updated, and concur with information scribed by Joyice Faster, CMA Brayton Mars, MD  Note: This dictation was prepared with Dragon dictation along with smaller phrase technology. Any transcriptional errors that result from this process are unintentional.  Addendum: Ultrasound is reviewed.  Patient has IUD in the endometrial cavity slightly displaced by submucosal fibroid 3.5 cm.  There is a bilobed right ovarian cyst.  PROCEDURE: IUD removal/IUD reinsertion IUD Insertion Procedure Note  Pre-operative Diagnosis: Desires contraception; lost IUD strings  Post-operative Diagnosis: same  Indications: contraception  Procedure Details  Urine pregnancy test was done  and result was negative.  The risks (including infection, bleeding, pain, and uterine perforation) and benefits of the procedure were explained to the patient and Verbal informed consent was obtained.   Cytobrush is used to identify IUD strings (which were brought down to the os.  [place] forceps is used to grasp the IUD strings and remove the previously inserted IUD. Cervix cleansed with Betadine. Uterus sounded to 10 cm. IUD inserted without difficulty. String visible and trimmed-2.5 cm. Patient  tolerated procedure well.  IUD Information: Mirena, Lot # [id number], Expiration date January 2022.  Condition: Stable  Complications: None  Plan:  The patient was advised to  call for any fever or for prolonged or severe pain or bleeding. She was advised to use OTC acetaminophen and OTC ibuprofen as needed for mild to moderate pain.   Attending Physician Documentation: Brayton Mars, MD   PLAN: 1.  Return in 4 weeks for IUD string check 2.  Return in 6 months for physical exam and ultrasound to assess uterine fibroid 3.  Monitor for any abnormal uterine bleeding with menstrual calendar  Brayton Mars, MD  Note: This dictation was prepared with Dragon dictation along with smaller phrase technology. Any transcriptional errors that result from this process are unintentional.

## 2017-12-17 ENCOUNTER — Telehealth: Payer: Self-pay | Admitting: Obstetrics and Gynecology

## 2017-12-17 ENCOUNTER — Ambulatory Visit (INDEPENDENT_AMBULATORY_CARE_PROVIDER_SITE_OTHER): Payer: 59

## 2017-12-17 ENCOUNTER — Encounter: Payer: Self-pay | Admitting: Obstetrics and Gynecology

## 2017-12-17 ENCOUNTER — Ambulatory Visit (INDEPENDENT_AMBULATORY_CARE_PROVIDER_SITE_OTHER): Payer: 59 | Admitting: Obstetrics and Gynecology

## 2017-12-17 VITALS — BP 95/60 | HR 75 | Ht 65.0 in | Wt 120.1 lb

## 2017-12-17 DIAGNOSIS — Z30431 Encounter for routine checking of intrauterine contraceptive device: Secondary | ICD-10-CM

## 2017-12-17 DIAGNOSIS — N898 Other specified noninflammatory disorders of vagina: Secondary | ICD-10-CM

## 2017-12-17 DIAGNOSIS — N83292 Other ovarian cyst, left side: Secondary | ICD-10-CM

## 2017-12-17 DIAGNOSIS — R3915 Urgency of urination: Secondary | ICD-10-CM | POA: Diagnosis not present

## 2017-12-17 DIAGNOSIS — D250 Submucous leiomyoma of uterus: Secondary | ICD-10-CM | POA: Insufficient documentation

## 2017-12-17 DIAGNOSIS — N951 Menopausal and female climacteric states: Secondary | ICD-10-CM

## 2017-12-17 DIAGNOSIS — Z01419 Encounter for gynecological examination (general) (routine) without abnormal findings: Secondary | ICD-10-CM

## 2017-12-17 DIAGNOSIS — T8332XA Displacement of intrauterine contraceptive device, initial encounter: Secondary | ICD-10-CM

## 2017-12-17 DIAGNOSIS — N939 Abnormal uterine and vaginal bleeding, unspecified: Secondary | ICD-10-CM | POA: Diagnosis not present

## 2017-12-17 DIAGNOSIS — Z1239 Encounter for other screening for malignant neoplasm of breast: Secondary | ICD-10-CM

## 2017-12-17 LAB — POCT URINALYSIS DIPSTICK
Bilirubin, UA: NEGATIVE
Glucose, UA: NEGATIVE
KETONES UA: NEGATIVE
Leukocytes, UA: NEGATIVE
NITRITE UA: NEGATIVE
Odor: NEGATIVE
PROTEIN UA: NEGATIVE
RBC UA: NEGATIVE
SPEC GRAV UA: 1.015 (ref 1.010–1.025)
UROBILINOGEN UA: 0.2 U/dL
pH, UA: 6.5 (ref 5.0–8.0)

## 2017-12-17 NOTE — Telephone Encounter (Signed)
Pt request pain meds for iud insert.  Encouraged pt take ibup- 800mg .

## 2017-12-17 NOTE — Patient Instructions (Signed)
1.  Pap smear is not done.  Next Pap smear is due 2021. 2.  Mammogram is ordered. 3.  Screening labs are obtained through primary care 4.  Mirena IUD is removed today.  Ring IUD is inserted today. 5.  Return in 4 weeks for IUD string check 6.  Continue with healthy eating and exercise 7.  Return in 1 year for annual exam  Health Maintenance, Female Adopting a healthy lifestyle and getting preventive care can go a long way to promote health and wellness. Talk with your health care provider about what schedule of regular examinations is right for you. This is a good chance for you to check in with your provider about disease prevention and staying healthy. In between checkups, there are plenty of things you can do on your own. Experts have done a lot of research about which lifestyle changes and preventive measures are most likely to keep you healthy. Ask your health care provider for more information. Weight and diet Eat a healthy diet  Be sure to include plenty of vegetables, fruits, low-fat dairy products, and lean protein.  Do not eat a lot of foods high in solid fats, added sugars, or salt.  Get regular exercise. This is one of the most important things you can do for your health. ? Most adults should exercise for at least 150 minutes each week. The exercise should increase your heart rate and make you sweat (moderate-intensity exercise). ? Most adults should also do strengthening exercises at least twice a week. This is in addition to the moderate-intensity exercise.  Maintain a healthy weight  Body mass index (BMI) is a measurement that can be used to identify possible weight problems. It estimates body fat based on height and weight. Your health care provider can help determine your BMI and help you achieve or maintain a healthy weight.  For females 18 years of age and older: ? A BMI below 18.5 is considered underweight. ? A BMI of 18.5 to 24.9 is normal. ? A BMI of 25 to 29.9 is  considered overweight. ? A BMI of 30 and above is considered obese.  Watch levels of cholesterol and blood lipids  You should start having your blood tested for lipids and cholesterol at 46 years of age, then have this test every 5 years.  You may need to have your cholesterol levels checked more often if: ? Your lipid or cholesterol levels are high. ? You are older than 46 years of age. ? You are at high risk for heart disease.  Cancer screening Lung Cancer  Lung cancer screening is recommended for adults 39-19 years old who are at high risk for lung cancer because of a history of smoking.  A yearly low-dose CT scan of the lungs is recommended for people who: ? Currently smoke. ? Have quit within the past 15 years. ? Have at least a 30-pack-year history of smoking. A pack year is smoking an average of one pack of cigarettes a day for 1 year.  Yearly screening should continue until it has been 15 years since you quit.  Yearly screening should stop if you develop a health problem that would prevent you from having lung cancer treatment.  Breast Cancer  Practice breast self-awareness. This means understanding how your breasts normally appear and feel.  It also means doing regular breast self-exams. Let your health care provider know about any changes, no matter how small.  If you are in your 20s or 30s, you  should have a clinical breast exam (CBE) by a health care provider every 1-3 years as part of a regular health exam.  If you are 12 or older, have a CBE every year. Also consider having a breast X-ray (mammogram) every year.  If you have a family history of breast cancer, talk to your health care provider about genetic screening.  If you are at high risk for breast cancer, talk to your health care provider about having an MRI and a mammogram every year.  Breast cancer gene (BRCA) assessment is recommended for women who have family members with BRCA-related cancers.  BRCA-related cancers include: ? Breast. ? Ovarian. ? Tubal. ? Peritoneal cancers.  Results of the assessment will determine the need for genetic counseling and BRCA1 and BRCA2 testing.  Cervical Cancer Your health care provider may recommend that you be screened regularly for cancer of the pelvic organs (ovaries, uterus, and vagina). This screening involves a pelvic examination, including checking for microscopic changes to the surface of your cervix (Pap test). You may be encouraged to have this screening done every 3 years, beginning at age 66.  For women ages 65-65, health care providers may recommend pelvic exams and Pap testing every 3 years, or they may recommend the Pap and pelvic exam, combined with testing for human papilloma virus (HPV), every 5 years. Some types of HPV increase your risk of cervical cancer. Testing for HPV may also be done on women of any age with unclear Pap test results.  Other health care providers may not recommend any screening for nonpregnant women who are considered low risk for pelvic cancer and who do not have symptoms. Ask your health care provider if a screening pelvic exam is right for you.  If you have had past treatment for cervical cancer or a condition that could lead to cancer, you need Pap tests and screening for cancer for at least 20 years after your treatment. If Pap tests have been discontinued, your risk factors (such as having a new sexual partner) need to be reassessed to determine if screening should resume. Some women have medical problems that increase the chance of getting cervical cancer. In these cases, your health care provider may recommend more frequent screening and Pap tests.  Colorectal Cancer  This type of cancer can be detected and often prevented.  Routine colorectal cancer screening usually begins at 46 years of age and continues through 46 years of age.  Your health care provider may recommend screening at an earlier age if  you have risk factors for colon cancer.  Your health care provider may also recommend using home test kits to check for hidden blood in the stool.  A small camera at the end of a tube can be used to examine your colon directly (sigmoidoscopy or colonoscopy). This is done to check for the earliest forms of colorectal cancer.  Routine screening usually begins at age 43.  Direct examination of the colon should be repeated every 5-10 years through 46 years of age. However, you may need to be screened more often if early forms of precancerous polyps or small growths are found.  Skin Cancer  Check your skin from head to toe regularly.  Tell your health care provider about any new moles or changes in moles, especially if there is a change in a mole's shape or color.  Also tell your health care provider if you have a mole that is larger than the size of a pencil eraser.  Always use sunscreen. Apply sunscreen liberally and repeatedly throughout the day.  Protect yourself by wearing long sleeves, pants, a wide-brimmed hat, and sunglasses whenever you are outside.  Heart disease, diabetes, and high blood pressure  High blood pressure causes heart disease and increases the risk of stroke. High blood pressure is more likely to develop in: ? People who have blood pressure in the high end of the normal range (130-139/85-89 mm Hg). ? People who are overweight or obese. ? People who are African American.  If you are 75-54 years of age, have your blood pressure checked every 3-5 years. If you are 31 years of age or older, have your blood pressure checked every year. You should have your blood pressure measured twice-once when you are at a hospital or clinic, and once when you are not at a hospital or clinic. Record the average of the two measurements. To check your blood pressure when you are not at a hospital or clinic, you can use: ? An automated blood pressure machine at a pharmacy. ? A home blood  pressure monitor.  If you are between 76 years and 71 years old, ask your health care provider if you should take aspirin to prevent strokes.  Have regular diabetes screenings. This involves taking a blood sample to check your fasting blood sugar level. ? If you are at a normal weight and have a low risk for diabetes, have this test once every three years after 46 years of age. ? If you are overweight and have a high risk for diabetes, consider being tested at a younger age or more often. Preventing infection Hepatitis B  If you have a higher risk for hepatitis B, you should be screened for this virus. You are considered at high risk for hepatitis B if: ? You were born in a country where hepatitis B is common. Ask your health care provider which countries are considered high risk. ? Your parents were born in a high-risk country, and you have not been immunized against hepatitis B (hepatitis B vaccine). ? You have HIV or AIDS. ? You use needles to inject street drugs. ? You live with someone who has hepatitis B. ? You have had sex with someone who has hepatitis B. ? You get hemodialysis treatment. ? You take certain medicines for conditions, including cancer, organ transplantation, and autoimmune conditions.  Hepatitis C  Blood testing is recommended for: ? Everyone born from [birth year] through 1964. ? Anyone with known risk factors for hepatitis C.  Sexually transmitted infections (STIs)  You should be screened for sexually transmitted infections (STIs) including gonorrhea and chlamydia if: ? You are sexually active and are younger than 46 years of age. ? You are older than 46 years of age and your health care provider tells you that you are at risk for this type of infection. ? Your sexual activity has changed since you were last screened and you are at an increased risk for chlamydia or gonorrhea. Ask your health care provider if you are at risk.  If you do not have HIV, but are at risk,  it may be recommended that you take a prescription medicine daily to prevent HIV infection. This is called pre-exposure prophylaxis (PrEP). You are considered at risk if: ? You are sexually active and do not regularly use condoms or know the HIV status of your partner(s). ? You take drugs by injection. ? You are sexually active with a partner who has HIV.  Talk with  your health care provider about whether you are at high risk of being infected with HIV. If you choose to begin PrEP, you should first be tested for HIV. You should then be tested every 3 months for as long as you are taking PrEP. Pregnancy  If you are premenopausal and you may become pregnant, ask your health care provider about preconception counseling.  If you may become pregnant, take 400 to 800 micrograms (mcg) of folic acid every day.  If you want to prevent pregnancy, talk to your health care provider about birth control (contraception). Osteoporosis and menopause  Osteoporosis is a disease in which the bones lose minerals and strength with aging. This can result in serious bone fractures. Your risk for osteoporosis can be identified using a bone density scan.  If you are 30 years of age or older, or if you are at risk for osteoporosis and fractures, ask your health care provider if you should be screened.  Ask your health care provider whether you should take a calcium or vitamin D supplement to lower your risk for osteoporosis.  Menopause may have certain physical symptoms and risks.  Hormone replacement therapy may reduce some of these symptoms and risks. Talk to your health care provider about whether hormone replacement therapy is right for you. Follow these instructions at home:  Schedule regular health, dental, and eye exams.  Stay current with your immunizations.  Do not use any tobacco products including cigarettes, chewing tobacco, or electronic cigarettes.  If you are pregnant, do not drink  alcohol.  If you are breastfeeding, limit how much and how often you drink alcohol.  Limit alcohol intake to no more than 1 drink per day for nonpregnant women. One drink equals 12 ounces of beer, 5 ounces of wine, or 1 ounces of hard liquor.  Do not use street drugs.  Do not share needles.  Ask your health care provider for help if you need support or information about quitting drugs.  Tell your health care provider if you often feel depressed.  Tell your health care provider if you have ever been abused or do not feel safe at home. This information is not intended to replace advice given to you by your health care provider. Make sure you discuss any questions you have with your health care provider. Document Released: 08/13/2010 Document Revised: 07/06/2015 Document Reviewed: 11/01/2014 Elsevier Interactive Patient Education  [employer].

## 2017-12-17 NOTE — Telephone Encounter (Signed)
The patient called and stated that she would like to speak with Joyice Faster before her appointment today if possible. Please advise.

## 2017-12-18 LAB — FOLLICLE STIMULATING HORMONE: FSH: 6 m[IU]/mL

## 2017-12-30 ENCOUNTER — Other Ambulatory Visit: Payer: Self-pay | Admitting: Family Medicine

## 2017-12-30 DIAGNOSIS — K219 Gastro-esophageal reflux disease without esophagitis: Secondary | ICD-10-CM

## 2018-02-24 DIAGNOSIS — F331 Major depressive disorder, recurrent, moderate: Secondary | ICD-10-CM | POA: Diagnosis not present

## 2018-03-25 ENCOUNTER — Other Ambulatory Visit: Payer: Self-pay | Admitting: Family Medicine

## 2018-03-25 DIAGNOSIS — E538 Deficiency of other specified B group vitamins: Secondary | ICD-10-CM

## 2018-05-11 ENCOUNTER — Telehealth: Payer: Self-pay | Admitting: Obstetrics and Gynecology

## 2018-05-11 NOTE — Telephone Encounter (Signed)
Patient called requesting an order for a mammogram. She was a Dr D patient. Thanks

## 2018-05-13 NOTE — Telephone Encounter (Signed)
LM for patient to call back.

## 2018-05-25 ENCOUNTER — Other Ambulatory Visit: Payer: Self-pay | Admitting: Family Medicine

## 2018-05-25 DIAGNOSIS — K219 Gastro-esophageal reflux disease without esophagitis: Secondary | ICD-10-CM

## 2018-05-26 DIAGNOSIS — F331 Major depressive disorder, recurrent, moderate: Secondary | ICD-10-CM | POA: Diagnosis not present

## 2018-06-01 NOTE — Telephone Encounter (Signed)
LM for patient to return call to find out where to send order.

## 2018-06-03 NOTE — Telephone Encounter (Signed)
LM for patient to return call.

## 2018-06-05 NOTE — Telephone Encounter (Signed)
LM for patient to return call.

## 2018-06-24 ENCOUNTER — Telehealth: Payer: Self-pay | Admitting: Obstetrics and Gynecology

## 2018-06-24 DIAGNOSIS — Z1239 Encounter for other screening for malignant neoplasm of breast: Secondary | ICD-10-CM

## 2018-06-24 NOTE — Telephone Encounter (Signed)
The patient called and stated she needs orders put in to have her mammogram done. I informed the patient that her nurse tried to reach out several times and left vm's the patient stated she did not receive any vm's. I verified the pt's contact #: [phone]. The patient also stated that she would like the orders to be sent to [facility] or [place]) location. And is also requesting a call back. Please advise.

## 2018-06-25 NOTE — Telephone Encounter (Signed)
Spoke with patient and placed Mammogram order.

## 2018-07-02 ENCOUNTER — Ambulatory Visit (INDEPENDENT_AMBULATORY_CARE_PROVIDER_SITE_OTHER): Payer: 59 | Admitting: Obstetrics and Gynecology

## 2018-07-02 ENCOUNTER — Encounter: Payer: Self-pay | Admitting: Obstetrics and Gynecology

## 2018-07-02 ENCOUNTER — Other Ambulatory Visit: Payer: Self-pay

## 2018-07-02 ENCOUNTER — Encounter: Payer: 59 | Admitting: Obstetrics and Gynecology

## 2018-07-02 VITALS — BP 96/72 | HR 80 | Ht 65.0 in | Wt 129.1 lb

## 2018-07-02 DIAGNOSIS — Z975 Presence of (intrauterine) contraceptive device: Secondary | ICD-10-CM

## 2018-07-02 DIAGNOSIS — T8332XA Displacement of intrauterine contraceptive device, initial encounter: Secondary | ICD-10-CM | POA: Diagnosis not present

## 2018-07-02 DIAGNOSIS — N882 Stricture and stenosis of cervix uteri: Secondary | ICD-10-CM | POA: Diagnosis not present

## 2018-07-02 DIAGNOSIS — R6882 Decreased libido: Secondary | ICD-10-CM

## 2018-07-02 DIAGNOSIS — N921 Excessive and frequent menstruation with irregular cycle: Secondary | ICD-10-CM

## 2018-07-02 DIAGNOSIS — N83202 Unspecified ovarian cyst, left side: Secondary | ICD-10-CM | POA: Diagnosis not present

## 2018-07-02 DIAGNOSIS — D250 Submucous leiomyoma of uterus: Secondary | ICD-10-CM | POA: Diagnosis not present

## 2018-07-02 MED ORDER — ESTRADIOL 1 MG PO TABS: 1.0000 mg | ORAL_TABLET | Freq: Every day | ORAL | 1 refills | Status: DC

## 2018-07-02 NOTE — Progress Notes (Signed)
Pt is present today for IUD check and Fibroid check. Pt stated that she was doing well no problems. Pt stated that she has noticed spotting and clots during breakthrough bleeding.

## 2018-07-02 NOTE — Progress Notes (Addendum)
GYNECOLOGY PROGRESS NOTE  Subjective:    Patient ID: Jaime Fletcher, female    DOB: 04/02/1970, 48 y.o.   MRN: [id number]  HPI  Patient is a 48 y.o. [phone] female who presents for 6 month follow up of fibroids, left ovarian cyst, and breakthrough bleeding on IUD. She was last seen by Dr. Malachi Paradise in November 2019 when she had her IUD replaced.  She notes that she has still had occasional light spotting with clots. Denies any pelvic pain.  Of note, patient also complains of decreased libido and difficulties maintaining weight. She knows this may be related to her getting closer to menopause but wonders what can be done.  Of the 2 issues she is more concerned with her libido. She has tried herbal remedies, changing scenery, erotica, without any improvement.    The following portions of the patient's history were reviewed and updated as appropriate: allergies, current medications, past family history, past medical history, past social history, past surgical history and problem list.  Review of Systems Pertinent items noted in HPI and remainder of comprehensive ROS otherwise negative.   Objective:   Blood pressure 96/72, pulse 80, height 5\' 5"  (1.651 m), weight 129 lb 1.6 oz (58.6 kg). General appearance: alert and no distress Abdomen: soft, non-tender; bowel sounds normal; no masses,  no organomegaly Pelvic: external genitalia normal, rectovaginal septum normal.  Vagina without discharge.  Cervix normal appearing, no lesions and no motion tenderness, stenotic, IUD threads not visible.  Uterus mobile, nontender, normal shape and size.  Adnexae non-palpable, nontender bilaterally.     Imaging:  US PELVIS TRANSVANGINAL NON-OB (TV ONLY) ULTRASOUND REPORT  Location: [facility] Date of Service:  12/17/2017  Indications: IUD check; threads lost Findings:  The uterus measures 9.6 x 5.7 x 4.8 cm. Echo texture is heterogeneous with evidence of focal masses. Within the  uterus are multiple suspected fibroids measuring: Fibroid 1: Submucosal, appears to be displacing the endometrium and IUD,  3.5 x 3.5 x 3.3 cm  The Endometrium measures 3.3 mm. The IUD appears to be in the correct location within the endometrium,  however, the endometrium and IUD appear to be displaced by the presence of  a fibroid.  The IUD strings are identified in the correct location within  the cervix.  Right Ovary measures 3.0 x 2.4 x 2.5 cm. It is normal in appearance. Left Ovary measures 2.9 x 2.3 x 2.2 cm and contains a small, thinly  septated cyst that measures 2.2 x 1.6 x 1.6 cm.  Survey of the adnexa demonstrates no adnexal masses. A small amount of free fluid is noted in the posterior cul de sac.  Impression: 1. Anteverted, fibroid uterus. 2. Submucosal fibroid displacing endometrium and IUD measures 3.5 x 3.5 x  3.3 cm. 3. Left ovary contains a small, thinly septated cyst measurig 2.2 x 1.6 x  1.6 cm. 4. IUD is in the correct location within the endometrial canal, however,  displaced by fibroid. 5. Free fluid noted in the posterior cul de sac.  Recommendations: 1.Clinical correlation with the patient's History and Physical Exam. 2.  Recommend repeat pelvic exam and ultrasound in 6 months to assess for  stability of cyst and fibroids  Dario Ave, RDMS Brayton Mars, MD   Assessment:   Submucous leiomyoma of uterus Breakthrough bleeding associated with intrauterine device (IUD) IUD threads lost Cervical stenosis Decreased libido  Plan:   1. Patient to be scheduled for repeat ultrasound to assess for  any potential growth for fibroid uterus as she is still having some breakthrough bleeding with the IUD.  2. Lost IUD threads, patient notes this happened with her last IUD. Likely secondary to cervical stenosis. Will assess location of IUD on her upcoming ultrasound.  3. Offered Estradiol x 2 weeks for breakthrough bleeding on IUD as patient has had  progesterone IUD x 4 and likely has prolonged progesterone effect.  4. Decreased libido, discussed options of Vyleesi vs testosterone injections. Discussed risks vs benefits. Given info on Vyleesi at patient's request. To notify MD if she desires a prescription.   Will notify patient of ultrasound results by phone.    A total of 15 minutes were spent face-to-face with the patient during this encounter and over half of that time dealt with counseling and coordination of care.  Rubie Maid, MD [facility]

## 2018-07-03 ENCOUNTER — Telehealth: Payer: Self-pay

## 2018-07-03 ENCOUNTER — Encounter: Payer: Self-pay | Admitting: Obstetrics and Gynecology

## 2018-07-03 DIAGNOSIS — N83202 Unspecified ovarian cyst, left side: Secondary | ICD-10-CM | POA: Insufficient documentation

## 2018-07-03 DIAGNOSIS — N882 Stricture and stenosis of cervix uteri: Secondary | ICD-10-CM | POA: Insufficient documentation

## 2018-07-03 DIAGNOSIS — T8332XA Displacement of intrauterine contraceptive device, initial encounter: Secondary | ICD-10-CM | POA: Insufficient documentation

## 2018-07-03 DIAGNOSIS — N921 Excessive and frequent menstruation with irregular cycle: Secondary | ICD-10-CM | POA: Insufficient documentation

## 2018-07-03 DIAGNOSIS — R6882 Decreased libido: Secondary | ICD-10-CM | POA: Insufficient documentation

## 2018-07-03 NOTE — Telephone Encounter (Signed)
Coronavirus (COVID-19) Are you at risk?  Are you at risk for the Coronavirus (COVID-19)?  To be considered HIGH RISK for Coronavirus (COVID-19), you have to meet the following criteria:  . Traveled to [place], [place], [place], [place] or [place]; or in the [place] to [place], [place], [place], or [place]; and have fever, cough, and shortness of breath within the last 2 weeks of travel OR . Been in close contact with a person diagnosed with COVID-19 within the last 2 weeks and have fever, cough, and shortness of breath . IF YOU DO NOT MEET THESE CRITERIA, YOU ARE CONSIDERED LOW RISK FOR COVID-19.  What to do if you are HIGH RISK for COVID-19?  . If you are having a medical emergency, call 911. . Seek medical care right away. Before you go to a doctor's office, urgent care or emergency department, call ahead and tell them about your recent travel, contact with someone diagnosed with COVID-19, and your symptoms. You should receive instructions from your physician's office regarding next steps of care.  . When you arrive at healthcare provider, tell the healthcare staff immediately you have returned from visiting [place], [place], [place], [place] or [place]; or traveled in the [place] to [place], [place], [place], or [place]; in the last two weeks or you have been in close contact with a person diagnosed with COVID-19 in the last 2 weeks.   . Tell the health care staff about your symptoms: fever, cough and shortness of breath. . After you have been seen by a medical provider, you will be either: o Tested for (COVID-19) and discharged home on quarantine except to seek medical care if symptoms worsen, and asked to  - Stay home and avoid contact with others until you get your results (4-5 days)  - Avoid travel on public transportation if possible (such as bus, train, or airplane) or o Sent to the Emergency Department by EMS for evaluation, COVID-19 testing, and possible  admission depending on your condition and test results.  What to do if you are LOW RISK for COVID-19?  Reduce your risk of any infection by using the same precautions used for avoiding the common cold or flu:  . Wash your hands often with soap and warm water for at least 20 seconds.  If soap and water are not readily available, use an alcohol-based hand sanitizer with at least 60% alcohol.  . If coughing or sneezing, cover your mouth and nose by coughing or sneezing into the elbow areas of your shirt or coat, into a tissue or into your sleeve (not your hands). . Avoid shaking hands with others and consider head nods or verbal greetings only. . Avoid touching your eyes, nose, or mouth with unwashed hands.  . Avoid close contact with people who are sick. . Avoid places or events with large numbers of people in one location, like concerts or sporting events. . Carefully consider travel plans you have or are making. . If you are planning any travel outside or inside the [place], visit the CDC's Travelers' Health webpage for the latest health notices. . If you have some symptoms but not all symptoms, continue to monitor at home and seek medical attention if your symptoms worsen. . If you are having a medical emergency, call 911.   ADDITIONAL HEALTHCARE OPTIONS FOR PATIENTS  [facility] / e-Visit: [email or web address]         [facility]: [phone]  [facility]: [phone]                   [facility]: [phone]   Prescreened. Neg .cm 

## 2018-07-03 NOTE — Addendum Note (Signed)
Addended by: Augusto Gamble on: 07/03/2018 06:48 PM   Modules accepted: Level of Service

## 2018-07-07 ENCOUNTER — Ambulatory Visit (INDEPENDENT_AMBULATORY_CARE_PROVIDER_SITE_OTHER): Payer: 59

## 2018-07-07 ENCOUNTER — Other Ambulatory Visit: Payer: Self-pay

## 2018-07-07 DIAGNOSIS — D250 Submucous leiomyoma of uterus: Secondary | ICD-10-CM

## 2018-07-09 ENCOUNTER — Ambulatory Visit (INDEPENDENT_AMBULATORY_CARE_PROVIDER_SITE_OTHER): Payer: 59 | Admitting: Obstetrics and Gynecology

## 2018-07-09 ENCOUNTER — Encounter: Payer: Self-pay | Admitting: Obstetrics and Gynecology

## 2018-07-09 ENCOUNTER — Other Ambulatory Visit: Payer: Self-pay

## 2018-07-09 ENCOUNTER — Other Ambulatory Visit: Payer: Self-pay | Admitting: Obstetrics and Gynecology

## 2018-07-09 ENCOUNTER — Ambulatory Visit
Admission: RE | Admit: 2018-07-09 | Discharge: 2018-07-09 | Disposition: A | Discharge status: Home / Self Care | Payer: 59 | Source: Ambulatory Visit | Attending: Obstetrics and Gynecology | Admitting: Obstetrics and Gynecology

## 2018-07-09 VITALS — BP 110/72 | Ht 63.0 in | Wt 128.1 lb

## 2018-07-09 DIAGNOSIS — Z1239 Encounter for other screening for malignant neoplasm of breast: Secondary | ICD-10-CM | POA: Diagnosis present

## 2018-07-09 DIAGNOSIS — Z1231 Encounter for screening mammogram for malignant neoplasm of breast: Secondary | ICD-10-CM | POA: Diagnosis not present

## 2018-07-09 DIAGNOSIS — N921 Excessive and frequent menstruation with irregular cycle: Secondary | ICD-10-CM

## 2018-07-09 DIAGNOSIS — Z975 Presence of (intrauterine) contraceptive device: Secondary | ICD-10-CM | POA: Diagnosis not present

## 2018-07-09 MED ORDER — ESTRADIOL 1 MG PO TABS: 1.0000 mg | ORAL_TABLET | Freq: Every day | ORAL | 0 refills | Status: DC

## 2018-07-09 NOTE — Progress Notes (Signed)
HPI:      [name] is a 48 y.o. [phone] who LMP was No LMP recorded. (Menstrual status: IUD).  Subjective:   She presents today patient states that she would like a second opinion regarding her uterine fibroid IUD and continued vaginal bleeding. Her history is that she has a known fibroid which is submucosal.  In addition she has a Mirena IUD which is slightly malpositioned.  She has had IUDs for many years and likes them for birth control as well as cycle control.  Recently she has had ongoing and persistent vaginal spotting.  It is occasionally associated with crampy discomfort but it is mostly the continued bleeding that is the problem.    Hx: The following portions of the patient's history were reviewed and updated as appropriate:             She  has a past medical history of Abnormal Pap smear of cervix, ADD (attention deficit disorder), Fibroid, and PTSD (post-traumatic stress disorder). She does not have any pertinent problems on file. She  has a past surgical history that includes Appendectomy; Tonsillectomy; Spine surgery; Dilation and curettage of uterus; cold knife bx; and Augmentation mammaplasty (Bilateral, 2005). Her family history includes Atrial fibrillation in her father; Breast cancer in her paternal aunt; Diabetes in her mother; Heart attack in her mother; Heart disease in her mother; Heart failure in her maternal grandmother; Hypertension in her father and mother. She  reports that she has never smoked. She has never used smokeless tobacco. She reports current alcohol use. She reports that she does not use drugs. She has a current medication list which includes the following prescription(s): alprazolam, clonazepam, cyanocobalamin, estradiol, fluticasone, levonorgestrel, methylphenidate, naproxen, nitroglycerin, pantoprazole, valacyclovir, and zolpidem. She is allergic to hydrocodone-acetaminophen; prednisone; and amitriptyline.       Review of Systems:  Review of  Systems  Constitutional: Denied constitutional symptoms, night sweats, recent illness, fatigue, fever, insomnia and weight loss.  Eyes: Denied eye symptoms, eye pain, photophobia, vision change and visual disturbance.  Ears/Nose/Throat/Neck: Denied ear, nose, throat or neck symptoms, hearing loss, nasal discharge, sinus congestion and sore throat.  Cardiovascular: Denied cardiovascular symptoms, arrhythmia, chest pain/pressure, edema, exercise intolerance, orthopnea and palpitations.  Respiratory: Denied pulmonary symptoms, asthma, pleuritic pain, productive sputum, cough, dyspnea and wheezing.  Gastrointestinal: Denied, gastro-esophageal reflux, melena, nausea and vomiting.  Genitourinary: See HPI for additional information.  Musculoskeletal: Denied musculoskeletal symptoms, stiffness, swelling, muscle weakness and myalgia.  Dermatologic: Denied dermatology symptoms, rash and scar.  Neurologic: Denied neurology symptoms, dizziness, headache, neck pain and syncope.  Psychiatric: Denied psychiatric symptoms, anxiety and depression.  Endocrine: Denied endocrine symptoms including hot flashes and night sweats.   Meds:   Current Outpatient Medications on File Prior to Visit  Medication Sig Dispense Refill  . ALPRAZolam (XANAX) 0.5 MG tablet Take 0.5 mg by mouth at bedtime as needed for anxiety. Dr Randel Books    . clonazePAM (KLONOPIN) 1 MG tablet Take 1 mg by mouth as needed. Dr Randel Books    . cyanocobalamin (,VITAMIN B-12,) 1000 MCG/ML injection Inject 1 mL (1,000 mcg total) into the muscle every 30 (thirty) days. 1 mL 5  . estradiol (ESTRACE) 1 MG tablet Take 1 tablet (1 mg total) by mouth daily for 14 days. 14 tablet 1  . fluticasone (FLONASE) 50 MCG/ACT nasal spray INSTILL 2 SPRAYS INTO EACH NOSTRIL DAILYAT BEDTIME    . levonorgestrel (MIRENA) 20 MCG/24HR IUD 1 each by Intrauterine route once.    Marland Kitchen  methylphenidate (RITALIN) 10 MG tablet Take 1 tablet by mouth 3 (three) times daily as needed. Dr  Randel Books  0  . naproxen (NAPROSYN) 500 MG tablet Take 1 tablet (500 mg total) by mouth 2 (two) times daily with a meal. (Patient not taking: Reported on 07/02/2018) 30 tablet 0  . nitroGLYCERIN (NITROSTAT) 0.4 MG SL tablet Place 1 tablet (0.4 mg total) under the tongue every 5 (five) minutes as needed for chest pain. (Patient not taking: Reported on 07/02/2018) 30 tablet 0  . pantoprazole (PROTONIX) 40 MG tablet TAKE 1 TABLET BY MOUTH ONCE DAILY 15 tablet 0  . valACYclovir (VALTREX) 1000 MG tablet Take 1 tablet (1,000 mg total) by mouth 2 (two) times daily. (Patient not taking: Reported on 07/02/2018) 20 tablet 0  . zolpidem (AMBIEN) 10 MG tablet Take 10 mg by mouth at bedtime as needed for sleep. Dr Randel Books     No current facility-administered medications on file prior to visit.     Objective:     Vitals:   07/09/18 1450  BP: 110/72              Ultrasound results reviewed in detail with the patient  Results show a sub-mucosal fibroid and slightly malpositioned IUD.  Endometrium is atrophic  Assessment:    [id number] Patient Active Problem List   Diagnosis Date Noted  . Decreased libido 07/03/2018  . Cervical stenosis (uterine cervix) 07/03/2018  . IUD threads lost 07/03/2018  . Breakthrough bleeding associated with intrauterine device (IUD) 07/03/2018  . Left ovarian cyst 07/03/2018  . Submucous leiomyoma of uterus 12/17/2017  . Paroxysmal tachycardia ([place]) 09/09/2017  . Chest pain 09/09/2017  . Insomnia 09/09/2017  . Depression 08/05/2017  . Abnormal Pap smear of cervix 11/20/2016  . Anxiety 11/20/2016  . Fibromyalgia 11/20/2016  . Encounter for insertion of intrauterine contraceptive device (IUD) 11/20/2016  . B12 deficiency 06/19/2015  . Vitamin D deficiency, unspecified 06/19/2015  . Vitamin D deficiency 06/19/2015  . Myopic astigmatism of both eyes 08/10/2014  . Palpitations 05/19/2013  . Attention deficit disorder 08/17/2010  . Dysthymic disorder 08/17/2010  .  Obsessive-compulsive disorder 08/17/2010  . Posttraumatic stress disorder 08/17/2010     1. Breakthrough bleeding associated with intrauterine device (IUD)     Her bleeding could be the result of several factors #1 atrophic endometrium and associated progesterone breakthrough bleeding.  #2 submucosal fibroid.  #3 slightly malpositioned IUD resulting in endometrial irritation and resultant bleeding.   Plan:            1.  I have agreed with Dr. Marcelline Mates that estrogen is an excellent first choice for thickening the endometrium in an attempt to control spotting.  Should this fail I would recommend IUD removal and cycle and bleeding control as well as birth control with OCPs.  Orders No orders of the defined types were placed in this encounter.   No orders of the defined types were placed in this encounter.     F/U  Return for Pt to contact us if symptoms worsen. I spent 23 minutes involved in the care of this patient of which greater than 50% was spent discussing progesterone breakthrough bleeding, malpositioned IUD, submucosal fibroids.  A general plan and strategy for each part of the plan was discussed in detail.  All of her questions were answered.  Finis Bud, M.D. 07/09/2018 3:36 PM

## 2018-07-13 ENCOUNTER — Other Ambulatory Visit: Payer: Self-pay | Admitting: Obstetrics and Gynecology

## 2018-07-13 DIAGNOSIS — R921 Mammographic calcification found on diagnostic imaging of breast: Secondary | ICD-10-CM

## 2018-07-13 DIAGNOSIS — R928 Other abnormal and inconclusive findings on diagnostic imaging of breast: Secondary | ICD-10-CM

## 2018-07-14 ENCOUNTER — Other Ambulatory Visit: Payer: Self-pay

## 2018-07-14 ENCOUNTER — Ambulatory Visit
Admission: RE | Admit: 2018-07-14 | Discharge: 2018-07-14 | Disposition: A | Discharge status: Home / Self Care | Payer: 59 | Source: Ambulatory Visit | Attending: Obstetrics and Gynecology | Admitting: Obstetrics and Gynecology

## 2018-07-14 ENCOUNTER — Telehealth: Payer: Self-pay | Admitting: Surgical

## 2018-07-14 DIAGNOSIS — R928 Other abnormal and inconclusive findings on diagnostic imaging of breast: Secondary | ICD-10-CM | POA: Diagnosis not present

## 2018-07-14 DIAGNOSIS — R921 Mammographic calcification found on diagnostic imaging of breast: Secondary | ICD-10-CM | POA: Diagnosis not present

## 2018-07-14 NOTE — Telephone Encounter (Signed)
Patient called and would like to continue with the the plan of the Estrace for a month. She will call us if she needs to come in sooner for problems.

## 2018-08-18 ENCOUNTER — Other Ambulatory Visit: Payer: Self-pay | Admitting: Obstetrics and Gynecology

## 2018-08-26 DIAGNOSIS — F331 Major depressive disorder, recurrent, moderate: Secondary | ICD-10-CM | POA: Diagnosis not present

## 2018-08-31 DIAGNOSIS — F331 Major depressive disorder, recurrent, moderate: Secondary | ICD-10-CM | POA: Diagnosis not present

## 2018-09-30 ENCOUNTER — Ambulatory Visit (INDEPENDENT_AMBULATORY_CARE_PROVIDER_SITE_OTHER): Payer: 59 | Admitting: Family Medicine

## 2018-09-30 ENCOUNTER — Other Ambulatory Visit: Payer: Self-pay

## 2018-09-30 VITALS — Temp 99.0°F | Ht 61.0 in | Wt 128.0 lb

## 2018-09-30 DIAGNOSIS — J0100 Acute maxillary sinusitis, unspecified: Secondary | ICD-10-CM

## 2018-09-30 MED ORDER — AZITHROMYCIN 250 MG PO TABS: ORAL_TABLET | ORAL | 0 refills | Status: DC

## 2018-09-30 NOTE — Progress Notes (Signed)
Date:  09/30/2018   Name:  Jaime Fletcher   DOB:  1971/01/19   MRN:  [id number]   Chief Complaint: Sinusitis (sore throat and ears hurt- feel full, headache, pressure around eyes)  I connected withthis patient, Jaime Fletcher, by telephoneat the patient's homee.  I verified that I am speaking with the correct person using two identifiers. This visit was conducted via telephone due to the Covid-19 outbreak from my office at [facility] in [place], [place]. I discussed the limitations, risks, security and privacy concerns of performing an evaluation and management service by telephone. I also discussed with the patient that there may be a patient responsible charge related to this service. The patient expressed understanding and agreed to proceed.  Sinusitis This is a recurrent (allergies all year long) problem. The current episode started in the past 7 days. The problem has been gradually worsening since onset. Maximum temperature: 99. Her pain is at a severity of 6/10. The pain is moderate. Associated symptoms include congestion, diaphoresis, ear pain, headaches and a sore throat. Pertinent negatives include no chills, coughing, hoarse voice, neck pain, shortness of breath, sinus pressure, sneezing or swollen glands. Treatments tried: steroid nasal.    Review of Systems  Constitutional: Positive for diaphoresis. Negative for chills, fatigue, fever and unexpected weight change.  HENT: Positive for congestion, ear pain and sore throat. Negative for ear discharge, hoarse voice, rhinorrhea, sinus pressure and sneezing.   Eyes: Negative for photophobia, pain, discharge, redness and itching.  Respiratory: Negative for cough, shortness of breath, wheezing and stridor.   Gastrointestinal: Negative for abdominal pain, blood in stool, constipation, diarrhea, nausea and vomiting.  Endocrine: Negative for cold intolerance, heat intolerance, polydipsia, polyphagia and polyuria.  Genitourinary:  Negative for dysuria, flank pain, frequency, hematuria, menstrual problem, pelvic pain, urgency, vaginal bleeding and vaginal discharge.  Musculoskeletal: Negative for arthralgias, back pain, myalgias and neck pain.  Skin: Negative for rash.  Allergic/Immunologic: Negative for environmental allergies and food allergies.  Neurological: Positive for headaches. Negative for dizziness, weakness, light-headedness and numbness.  Hematological: Negative for adenopathy. Does not bruise/bleed easily.  Psychiatric/Behavioral: Negative for dysphoric mood. The patient is not nervous/anxious.     Patient Active Problem List   Diagnosis Date Noted  . Decreased libido 07/03/2018  . Cervical stenosis (uterine cervix) 07/03/2018  . IUD threads lost 07/03/2018  . Breakthrough bleeding associated with intrauterine device (IUD) 07/03/2018  . Left ovarian cyst 07/03/2018  . Submucous leiomyoma of uterus 12/17/2017  . Paroxysmal tachycardia ([place]) 09/09/2017  . Chest pain 09/09/2017  . Insomnia 09/09/2017  . Depression 08/05/2017  . Abnormal Pap smear of cervix 11/20/2016  . Anxiety 11/20/2016  . Fibromyalgia 11/20/2016  . Encounter for insertion of intrauterine contraceptive device (IUD) 11/20/2016  . B12 deficiency 06/19/2015  . Vitamin D deficiency, unspecified 06/19/2015  . Vitamin D deficiency 06/19/2015  . Myopic astigmatism of both eyes 08/10/2014  . Palpitations 05/19/2013  . Attention deficit disorder 08/17/2010  . Dysthymic disorder 08/17/2010  . Obsessive-compulsive disorder 08/17/2010  . Posttraumatic stress disorder 08/17/2010    Allergies  Allergen Reactions  . Hydrocodone-Acetaminophen Nausea And Vomiting  . Prednisone Other (See Comments)    Insomnia, violence  . Amitriptyline Other (See Comments)    Tremors     Past Surgical History:  Procedure Laterality Date  . APPENDECTOMY    . AUGMENTATION MAMMAPLASTY Bilateral 2005  . cold knife bx    . DILATION AND CURETTAGE OF  UTERUS    .  SPINE SURGERY    . TONSILLECTOMY      Social History   Tobacco Use  . Smoking status: Never Smoker  . Smokeless tobacco: Never Used  Substance Use Topics  . Alcohol use: Yes    Comment: social  . Drug use: No     Medication list has been reviewed and updated.  Current Meds  Medication Sig  . ALPRAZolam (XANAX) 0.5 MG tablet Take 0.5 mg by mouth at bedtime as needed for anxiety. Dr Randel Books  . clonazePAM (KLONOPIN) 1 MG tablet Take 1 mg by mouth as needed. Dr Randel Books  . fluticasone (FLONASE) 50 MCG/ACT nasal spray INSTILL 2 SPRAYS INTO EACH NOSTRIL DAILYAT BEDTIME  . levonorgestrel (MIRENA) 20 MCG/24HR IUD 1 each by Intrauterine route once.  . methylphenidate (RITALIN) 10 MG tablet Take 1 tablet by mouth 3 (three) times daily as needed. Dr Randel Books  . pantoprazole (PROTONIX) 40 MG tablet TAKE 1 TABLET BY MOUTH ONCE DAILY    PHQ 2/9 Scores 09/30/2018 08/05/2017  PHQ - 2 Score 0 0  PHQ- 9 Score 3 4    BP Readings from Last 3 Encounters:  07/09/18 110/72  07/02/18 96/72  12/17/17 95/60    Physical Exam Vitals signs and nursing note reviewed.  HENT:     Nose:     Right Sinus: Maxillary sinus tenderness and frontal sinus tenderness present.     Left Sinus: Maxillary sinus tenderness and frontal sinus tenderness present.     Mouth/Throat:     Pharynx: Posterior oropharyngeal erythema present.  Neck:     Musculoskeletal: Normal range of motion and neck supple. Muscular tenderness present.     Wt Readings from Last 3 Encounters:  09/30/18 128 lb (58.1 kg)  07/09/18 128 lb 1.6 oz (58.1 kg)  07/02/18 129 lb 1.6 oz (58.6 kg)    Temp 99 F (37.2 C) (Oral)   Ht 5\' 1"  (1.549 m)   Wt 128 lb (58.1 kg)   BMI 24.19 kg/m   Assessment and Plan: 1. Acute maxillary sinusitis, recurrence not specified Patient with symptoms and self exam that is suggestive of maxillary sinusitis.  Will initiate a azithromycin 250 mg 2 today followed by 1 a day for 4 days. -  azithromycin (ZITHROMAX) 250 MG tablet; 2 today then 1 a day for 4 days  Dispense: 6 tablet; Refill: 0   I spent 15 minutes with this patient, More than 50% of that time was spent in face to face education, counseling and care coordination.

## 2018-11-23 DIAGNOSIS — F331 Major depressive disorder, recurrent, moderate: Secondary | ICD-10-CM | POA: Diagnosis not present

## 2019-03-02 DIAGNOSIS — F1320 Sedative, hypnotic or anxiolytic dependence, uncomplicated: Secondary | ICD-10-CM | POA: Diagnosis not present

## 2019-03-02 DIAGNOSIS — Z1389 Encounter for screening for other disorder: Secondary | ICD-10-CM | POA: Diagnosis not present

## 2019-03-30 DIAGNOSIS — F330 Major depressive disorder, recurrent, mild: Secondary | ICD-10-CM | POA: Diagnosis not present

## 2019-03-30 DIAGNOSIS — F4000 Agoraphobia, unspecified: Secondary | ICD-10-CM | POA: Diagnosis not present

## 2019-03-30 DIAGNOSIS — F1320 Sedative, hypnotic or anxiolytic dependence, uncomplicated: Secondary | ICD-10-CM | POA: Diagnosis not present

## 2019-03-30 DIAGNOSIS — F4002 Agoraphobia without panic disorder: Secondary | ICD-10-CM | POA: Diagnosis not present

## 2019-03-30 DIAGNOSIS — F4310 Post-traumatic stress disorder, unspecified: Secondary | ICD-10-CM | POA: Diagnosis not present

## 2019-03-30 DIAGNOSIS — F4001 Agoraphobia with panic disorder: Secondary | ICD-10-CM | POA: Diagnosis not present

## 2019-03-30 DIAGNOSIS — F410 Panic disorder [episodic paroxysmal anxiety] without agoraphobia: Secondary | ICD-10-CM | POA: Diagnosis not present

## 2019-04-26 DIAGNOSIS — F1320 Sedative, hypnotic or anxiolytic dependence, uncomplicated: Secondary | ICD-10-CM | POA: Diagnosis not present

## 2019-04-26 DIAGNOSIS — F4000 Agoraphobia, unspecified: Secondary | ICD-10-CM | POA: Diagnosis not present

## 2019-05-09 DIAGNOSIS — F419 Anxiety disorder, unspecified: Secondary | ICD-10-CM | POA: Diagnosis not present

## 2019-05-09 DIAGNOSIS — G4700 Insomnia, unspecified: Secondary | ICD-10-CM | POA: Diagnosis not present

## 2019-05-14 DIAGNOSIS — G4700 Insomnia, unspecified: Secondary | ICD-10-CM | POA: Diagnosis not present

## 2019-05-14 DIAGNOSIS — F419 Anxiety disorder, unspecified: Secondary | ICD-10-CM | POA: Diagnosis not present

## 2019-05-18 DIAGNOSIS — F419 Anxiety disorder, unspecified: Secondary | ICD-10-CM | POA: Diagnosis not present

## 2019-05-18 DIAGNOSIS — G4700 Insomnia, unspecified: Secondary | ICD-10-CM | POA: Diagnosis not present

## 2019-05-24 DIAGNOSIS — F1320 Sedative, hypnotic or anxiolytic dependence, uncomplicated: Secondary | ICD-10-CM | POA: Diagnosis not present

## 2019-05-24 DIAGNOSIS — F330 Major depressive disorder, recurrent, mild: Secondary | ICD-10-CM | POA: Diagnosis not present

## 2019-05-24 DIAGNOSIS — F4310 Post-traumatic stress disorder, unspecified: Secondary | ICD-10-CM | POA: Diagnosis not present

## 2019-05-24 DIAGNOSIS — F410 Panic disorder [episodic paroxysmal anxiety] without agoraphobia: Secondary | ICD-10-CM | POA: Diagnosis not present

## 2019-05-24 DIAGNOSIS — F419 Anxiety disorder, unspecified: Secondary | ICD-10-CM | POA: Diagnosis not present

## 2019-05-24 DIAGNOSIS — F4001 Agoraphobia with panic disorder: Secondary | ICD-10-CM | POA: Diagnosis not present

## 2019-05-24 DIAGNOSIS — G4700 Insomnia, unspecified: Secondary | ICD-10-CM | POA: Diagnosis not present

## 2019-05-30 DIAGNOSIS — F419 Anxiety disorder, unspecified: Secondary | ICD-10-CM | POA: Diagnosis not present

## 2019-05-30 DIAGNOSIS — G4700 Insomnia, unspecified: Secondary | ICD-10-CM | POA: Diagnosis not present

## 2019-06-06 DIAGNOSIS — G4700 Insomnia, unspecified: Secondary | ICD-10-CM | POA: Diagnosis not present

## 2019-06-06 DIAGNOSIS — F419 Anxiety disorder, unspecified: Secondary | ICD-10-CM | POA: Diagnosis not present

## 2019-06-15 DIAGNOSIS — F419 Anxiety disorder, unspecified: Secondary | ICD-10-CM | POA: Diagnosis not present

## 2019-06-15 DIAGNOSIS — G4700 Insomnia, unspecified: Secondary | ICD-10-CM | POA: Diagnosis not present

## 2019-06-18 DIAGNOSIS — G4700 Insomnia, unspecified: Secondary | ICD-10-CM | POA: Diagnosis not present

## 2019-06-18 DIAGNOSIS — F419 Anxiety disorder, unspecified: Secondary | ICD-10-CM | POA: Diagnosis not present

## 2019-06-22 DIAGNOSIS — F4001 Agoraphobia with panic disorder: Secondary | ICD-10-CM | POA: Diagnosis not present

## 2019-06-22 DIAGNOSIS — F419 Anxiety disorder, unspecified: Secondary | ICD-10-CM | POA: Diagnosis not present

## 2019-06-22 DIAGNOSIS — F410 Panic disorder [episodic paroxysmal anxiety] without agoraphobia: Secondary | ICD-10-CM | POA: Diagnosis not present

## 2019-06-22 DIAGNOSIS — G4700 Insomnia, unspecified: Secondary | ICD-10-CM | POA: Diagnosis not present

## 2019-06-22 DIAGNOSIS — F1320 Sedative, hypnotic or anxiolytic dependence, uncomplicated: Secondary | ICD-10-CM | POA: Diagnosis not present

## 2019-06-22 DIAGNOSIS — F4310 Post-traumatic stress disorder, unspecified: Secondary | ICD-10-CM | POA: Diagnosis not present

## 2019-06-28 DIAGNOSIS — F419 Anxiety disorder, unspecified: Secondary | ICD-10-CM | POA: Diagnosis not present

## 2019-06-28 DIAGNOSIS — G4700 Insomnia, unspecified: Secondary | ICD-10-CM | POA: Diagnosis not present

## 2019-07-04 DIAGNOSIS — F419 Anxiety disorder, unspecified: Secondary | ICD-10-CM | POA: Diagnosis not present

## 2019-07-04 DIAGNOSIS — G4700 Insomnia, unspecified: Secondary | ICD-10-CM | POA: Diagnosis not present

## 2019-07-15 DIAGNOSIS — G4700 Insomnia, unspecified: Secondary | ICD-10-CM | POA: Diagnosis not present

## 2019-07-15 DIAGNOSIS — F419 Anxiety disorder, unspecified: Secondary | ICD-10-CM | POA: Diagnosis not present

## 2019-07-20 DIAGNOSIS — F1320 Sedative, hypnotic or anxiolytic dependence, uncomplicated: Secondary | ICD-10-CM | POA: Diagnosis not present

## 2019-07-20 DIAGNOSIS — F4000 Agoraphobia, unspecified: Secondary | ICD-10-CM | POA: Diagnosis not present

## 2019-07-20 DIAGNOSIS — F419 Anxiety disorder, unspecified: Secondary | ICD-10-CM | POA: Diagnosis not present

## 2019-07-20 DIAGNOSIS — F411 Generalized anxiety disorder: Secondary | ICD-10-CM | POA: Diagnosis not present

## 2019-07-20 DIAGNOSIS — F410 Panic disorder [episodic paroxysmal anxiety] without agoraphobia: Secondary | ICD-10-CM | POA: Diagnosis not present

## 2019-07-20 DIAGNOSIS — G4700 Insomnia, unspecified: Secondary | ICD-10-CM | POA: Diagnosis not present

## 2019-07-20 DIAGNOSIS — F4310 Post-traumatic stress disorder, unspecified: Secondary | ICD-10-CM | POA: Diagnosis not present

## 2019-07-29 DIAGNOSIS — F419 Anxiety disorder, unspecified: Secondary | ICD-10-CM | POA: Diagnosis not present

## 2019-07-29 DIAGNOSIS — G4700 Insomnia, unspecified: Secondary | ICD-10-CM | POA: Diagnosis not present

## 2019-08-02 DIAGNOSIS — F419 Anxiety disorder, unspecified: Secondary | ICD-10-CM | POA: Diagnosis not present

## 2019-08-02 DIAGNOSIS — G4700 Insomnia, unspecified: Secondary | ICD-10-CM | POA: Diagnosis not present

## 2019-08-17 DIAGNOSIS — F330 Major depressive disorder, recurrent, mild: Secondary | ICD-10-CM | POA: Diagnosis not present

## 2019-08-17 DIAGNOSIS — F410 Panic disorder [episodic paroxysmal anxiety] without agoraphobia: Secondary | ICD-10-CM | POA: Diagnosis not present

## 2019-08-17 DIAGNOSIS — F4310 Post-traumatic stress disorder, unspecified: Secondary | ICD-10-CM | POA: Diagnosis not present

## 2019-08-17 DIAGNOSIS — F1320 Sedative, hypnotic or anxiolytic dependence, uncomplicated: Secondary | ICD-10-CM | POA: Diagnosis not present

## 2019-08-17 DIAGNOSIS — F411 Generalized anxiety disorder: Secondary | ICD-10-CM | POA: Diagnosis not present

## 2019-08-22 DIAGNOSIS — F419 Anxiety disorder, unspecified: Secondary | ICD-10-CM | POA: Diagnosis not present

## 2019-08-22 DIAGNOSIS — G4700 Insomnia, unspecified: Secondary | ICD-10-CM | POA: Diagnosis not present

## 2019-09-14 DIAGNOSIS — F1320 Sedative, hypnotic or anxiolytic dependence, uncomplicated: Secondary | ICD-10-CM | POA: Diagnosis not present

## 2019-09-14 DIAGNOSIS — F411 Generalized anxiety disorder: Secondary | ICD-10-CM | POA: Diagnosis not present

## 2019-10-11 ENCOUNTER — Telehealth: Payer: Self-pay | Admitting: Obstetrics and Gynecology

## 2019-10-11 ENCOUNTER — Encounter: Payer: Self-pay | Admitting: Surgical

## 2019-10-11 NOTE — Telephone Encounter (Signed)
Pt called in and stated that she needs an order for an ultrasound and blood work before her annual appt the pt is wanting to have everything done at that one appt. I told the pt I would have to send a message to the nurse. I cant make an appt for an u/s or lab work without an order.  The pt stated that she understood. She also said that she can talk directly to the nurse thur mychart. Please advise

## 2019-10-11 NOTE — Telephone Encounter (Signed)
Sent patient mychart message

## 2019-10-15 DIAGNOSIS — Z7689 Persons encountering health services in other specified circumstances: Secondary | ICD-10-CM | POA: Diagnosis not present

## 2019-10-15 DIAGNOSIS — D259 Leiomyoma of uterus, unspecified: Secondary | ICD-10-CM | POA: Diagnosis not present

## 2019-10-15 DIAGNOSIS — N939 Abnormal uterine and vaginal bleeding, unspecified: Secondary | ICD-10-CM | POA: Diagnosis not present

## 2019-11-08 DIAGNOSIS — D259 Leiomyoma of uterus, unspecified: Secondary | ICD-10-CM | POA: Diagnosis not present

## 2019-11-08 DIAGNOSIS — E538 Deficiency of other specified B group vitamins: Secondary | ICD-10-CM | POA: Diagnosis not present

## 2019-11-08 DIAGNOSIS — R5383 Other fatigue: Secondary | ICD-10-CM | POA: Diagnosis not present

## 2019-11-08 DIAGNOSIS — E559 Vitamin D deficiency, unspecified: Secondary | ICD-10-CM | POA: Diagnosis not present

## 2019-11-08 DIAGNOSIS — Z01411 Encounter for gynecological examination (general) (routine) with abnormal findings: Secondary | ICD-10-CM | POA: Diagnosis not present

## 2019-11-08 DIAGNOSIS — Z1151 Encounter for screening for human papillomavirus (HPV): Secondary | ICD-10-CM | POA: Diagnosis not present

## 2019-11-08 DIAGNOSIS — Z113 Encounter for screening for infections with a predominantly sexual mode of transmission: Secondary | ICD-10-CM | POA: Diagnosis not present

## 2019-11-08 DIAGNOSIS — Z0000 Encounter for general adult medical examination without abnormal findings: Secondary | ICD-10-CM | POA: Diagnosis not present

## 2019-11-08 DIAGNOSIS — N939 Abnormal uterine and vaginal bleeding, unspecified: Secondary | ICD-10-CM | POA: Diagnosis not present

## 2019-11-08 DIAGNOSIS — Z1331 Encounter for screening for depression: Secondary | ICD-10-CM | POA: Diagnosis not present

## 2019-11-08 DIAGNOSIS — T8332XA Displacement of intrauterine contraceptive device, initial encounter: Secondary | ICD-10-CM | POA: Diagnosis not present

## 2019-11-08 LAB — HM PAP SMEAR: HM Pap smear: NORMAL

## 2019-11-23 ENCOUNTER — Ambulatory Visit (INDEPENDENT_AMBULATORY_CARE_PROVIDER_SITE_OTHER): Payer: 59 | Admitting: Family Medicine

## 2019-11-23 ENCOUNTER — Other Ambulatory Visit: Payer: Self-pay

## 2019-11-23 ENCOUNTER — Encounter: Payer: Self-pay | Admitting: Family Medicine

## 2019-11-23 VITALS — BP 110/90 | HR 88 | Ht 61.0 in | Wt 135.0 lb

## 2019-11-23 DIAGNOSIS — F411 Generalized anxiety disorder: Secondary | ICD-10-CM | POA: Diagnosis not present

## 2019-11-23 DIAGNOSIS — F1320 Sedative, hypnotic or anxiolytic dependence, uncomplicated: Secondary | ICD-10-CM | POA: Diagnosis not present

## 2019-11-23 DIAGNOSIS — E538 Deficiency of other specified B group vitamins: Secondary | ICD-10-CM | POA: Diagnosis not present

## 2019-11-23 DIAGNOSIS — J301 Allergic rhinitis due to pollen: Secondary | ICD-10-CM

## 2019-11-23 DIAGNOSIS — F410 Panic disorder [episodic paroxysmal anxiety] without agoraphobia: Secondary | ICD-10-CM | POA: Diagnosis not present

## 2019-11-23 DIAGNOSIS — K219 Gastro-esophageal reflux disease without esophagitis: Secondary | ICD-10-CM

## 2019-11-23 DIAGNOSIS — F4310 Post-traumatic stress disorder, unspecified: Secondary | ICD-10-CM | POA: Diagnosis not present

## 2019-11-23 MED ORDER — FLUTICASONE PROPIONATE 50 MCG/ACT NA SUSP: NASAL | 11 refills | Status: AC

## 2019-11-23 MED ORDER — PANTOPRAZOLE SODIUM 40 MG PO TBEC: 40.0000 mg | DELAYED_RELEASE_TABLET | Freq: Every day | ORAL | 1 refills | Status: DC

## 2019-11-23 MED ORDER — CYANOCOBALAMIN 1000 MCG/ML IJ SOLN: 1000.0000 ug | INTRAMUSCULAR | 5 refills | Status: DC

## 2019-11-23 NOTE — Progress Notes (Signed)
Date:  11/23/2019   Name:  Jaime Fletcher   DOB:  02-24-1970   MRN:  [id number]   Chief Complaint: Gastroesophageal Reflux and vitamin b12 def (273- has not taken injections since 2019- taking otc B12)  Gastroesophageal Reflux She reports no abdominal pain, no belching, no chest pain, no choking, no coughing, no dysphagia, no early satiety, no globus sensation, no heartburn, no hoarse voice, no nausea, no sore throat, no stridor, no tooth decay, no water brash or no wheezing. This is a chronic problem. The current episode started more than 1 year ago. The problem has been waxing and waning. The symptoms are aggravated by certain foods. Pertinent negatives include no fatigue. She has tried nothing (use to be on pantoprazole) for the symptoms. The treatment provided moderate relief. Past procedures do not include an abdominal ultrasound, an EGD, esophageal manometry, esophageal pH monitoring, H. pylori antibody titer or a UGI.    Lab Results  Component Value Date   CREATININE 0.68 09/03/2017   BUN 15 09/03/2017   NA 138 09/03/2017   K 3.7 09/03/2017   CL 105 09/03/2017   CO2 23 09/03/2017   No results found for: CHOL, HDL, LDLCALC, LDLDIRECT, TRIG, CHOLHDL Lab Results  Component Value Date   TSH 1.620 08/05/2017   Lab Results  Component Value Date   HGBA1C 4.9 08/05/2017   Lab Results  Component Value Date   WBC 8.7 09/03/2017   HGB 13.8 09/03/2017   HCT 39.9 09/03/2017   MCV 101.0 (H) 09/03/2017   PLT 221 09/03/2017   Lab Results  Component Value Date   ALT 17 09/17/2016   AST 19 09/17/2016   ALKPHOS 41 09/17/2016   BILITOT 0.8 09/17/2016     Review of Systems  Constitutional: Negative.  Negative for chills, fatigue, fever and unexpected weight change.  HENT: Negative for congestion, ear discharge, ear pain, hoarse voice, rhinorrhea, sinus pressure, sneezing and sore throat.   Eyes: Negative for photophobia, pain, discharge, redness and itching.  Respiratory:  Negative for cough, choking, shortness of breath, wheezing and stridor.   Cardiovascular: Negative for chest pain.  Gastrointestinal: Negative for abdominal pain, blood in stool, constipation, diarrhea, dysphagia, heartburn, nausea and vomiting.  Endocrine: Negative for cold intolerance, heat intolerance, polydipsia, polyphagia and polyuria.  Genitourinary: Negative for dysuria, flank pain, frequency, hematuria, menstrual problem, pelvic pain, urgency, vaginal bleeding and vaginal discharge.  Musculoskeletal: Negative for arthralgias, back pain and myalgias.  Skin: Negative for rash.  Allergic/Immunologic: Negative for environmental allergies and food allergies.  Neurological: Negative for dizziness, weakness, light-headedness, numbness and headaches.  Hematological: Negative for adenopathy. Does not bruise/bleed easily.  Psychiatric/Behavioral: Negative for dysphoric mood. The patient is not nervous/anxious.     Patient Active Problem List   Diagnosis Date Noted   Decreased libido 07/03/2018   Cervical stenosis (uterine cervix) 07/03/2018   IUD threads lost 07/03/2018   Breakthrough bleeding associated with intrauterine device (IUD) 07/03/2018   Left ovarian cyst 07/03/2018   Submucous leiomyoma of uterus 12/17/2017   Paroxysmal tachycardia (HCC) 09/09/2017   Chest pain 09/09/2017   Insomnia 09/09/2017   Depression 08/05/2017   Abnormal Pap smear of cervix 11/20/2016   Anxiety 11/20/2016   Fibromyalgia 11/20/2016   Encounter for insertion of intrauterine contraceptive device (IUD) 11/20/2016   B12 deficiency 06/19/2015   Vitamin D deficiency, unspecified 06/19/2015   Vitamin D deficiency 06/19/2015   Myopic astigmatism of both eyes 08/10/2014   Palpitations 05/19/2013   Attention deficit  disorder 08/17/2010   Dysthymic disorder 08/17/2010   Obsessive-compulsive disorder 08/17/2010   Posttraumatic stress disorder 08/17/2010    Allergies  Allergen  Reactions   Hydrocodone-Acetaminophen Nausea And Vomiting   Prednisone Other (See Comments)    Insomnia, violence   Amitriptyline Other (See Comments)    Tremors     Past Surgical History:  Procedure Laterality Date   APPENDECTOMY     AUGMENTATION MAMMAPLASTY Bilateral 2005   cold knife bx     DILATION AND CURETTAGE OF UTERUS     SPINE SURGERY     TONSILLECTOMY      Social History   Tobacco Use   Smoking status: Never Smoker   Smokeless tobacco: Never Used  Vaping Use   Vaping Use: Never used  Substance Use Topics   Alcohol use: Yes    Comment: social   Drug use: No     Medication list has been reviewed and updated.  Current Meds  Medication Sig   levonorgestrel (MIRENA) 20 MCG/24HR IUD 1 each by Intrauterine route once.    PHQ 2/9 Scores 11/23/2019 09/30/2018 08/05/2017  PHQ - 2 Score 0 0 0  PHQ- 9 Score 0 3 4    GAD 7 : Generalized Anxiety Score 11/23/2019  Nervous, Anxious, on Edge 1  Control/stop worrying 0  Worry too much - different things 0  Trouble relaxing 1  Restless 1  Easily annoyed or irritable 0  Afraid - awful might happen 1  Total GAD 7 Score 4  Anxiety Difficulty Not difficult at all    BP Readings from Last 3 Encounters:  11/23/19 110/90  07/09/18 110/72  07/02/18 96/72    Physical Exam Vitals and nursing note reviewed.  Constitutional:      General: She is not in acute distress.    Appearance: She is not diaphoretic.  HENT:     Head: Normocephalic and atraumatic.     Right Ear: Tympanic membrane, ear canal and external ear normal.     Left Ear: Tympanic membrane, ear canal and external ear normal.     Nose: Nose normal.  Eyes:     General:        Right eye: No discharge.        Left eye: No discharge.     Conjunctiva/sclera: Conjunctivae normal.     Pupils: Pupils are equal, round, and reactive to light.  Neck:     Thyroid: No thyromegaly.     Vascular: No JVD.  Cardiovascular:     Rate and Rhythm:  Normal rate and regular rhythm.     Heart sounds: Normal heart sounds. No murmur heard.  No friction rub. No gallop.   Pulmonary:     Effort: Pulmonary effort is normal.     Breath sounds: Normal breath sounds.  Abdominal:     General: Bowel sounds are normal.     Palpations: Abdomen is soft. There is no mass.     Tenderness: There is no abdominal tenderness. There is no guarding.  Musculoskeletal:        General: Normal range of motion.     Cervical back: Normal range of motion and neck supple.  Lymphadenopathy:     Cervical: No cervical adenopathy.  Skin:    General: Skin is warm and dry.  Neurological:     Mental Status: She is alert.     Deep Tendon Reflexes: Reflexes are normal and symmetric.     Wt Readings from Last 3 Encounters:  11/23/19  135 lb (61.2 kg)  09/30/18 128 lb (58.1 kg)  07/09/18 128 lb 1.6 oz (58.1 kg)    BP 110/90    Pulse 88    Ht 5\' 1"  (1.549 m)    Wt 135 lb (61.2 kg)    BMI 25.51 kg/m   Assessment and Plan:  1. Gastroesophageal reflux disease without esophagitis Chronic.  Controlled.  Stable.  Continue pantoprazole 40 mg once a day. - pantoprazole (PROTONIX) 40 MG tablet; Take 1 tablet (40 mg total) by mouth daily.  Dispense: 90 tablet; Refill: 1  2. B12 deficiency Chronic.  Controlled.  Stable.  Will refill B12 1000 mcg/mL to be injected every month.  Patient will self administer. - cyanocobalamin (,VITAMIN B-12,) 1000 MCG/ML injection; Inject 1 mL (1,000 mcg total) into the muscle every 30 (thirty) days.  Dispense: 1 mL; Refill: 5  3. Seasonal allergic rhinitis due to pollen .  Controlled.  Stable.  Continue Flonase nasal inhalation 2 sprays in each nostril at bedtime. - fluticasone (FLONASE) 50 MCG/ACT nasal spray; INSTILL 2 SPRAYS INTO EACH NOSTRIL DAILYAT BEDTIME  Dispense: 11.1 mL; Refill: 11

## 2019-12-01 DIAGNOSIS — N939 Abnormal uterine and vaginal bleeding, unspecified: Secondary | ICD-10-CM | POA: Diagnosis not present

## 2019-12-01 DIAGNOSIS — T8332XA Displacement of intrauterine contraceptive device, initial encounter: Secondary | ICD-10-CM | POA: Diagnosis not present

## 2019-12-01 DIAGNOSIS — N941 Unspecified dyspareunia: Secondary | ICD-10-CM | POA: Diagnosis not present

## 2019-12-01 DIAGNOSIS — D259 Leiomyoma of uterus, unspecified: Secondary | ICD-10-CM | POA: Diagnosis not present

## 2019-12-13 ENCOUNTER — Encounter: Payer: Self-pay | Admitting: Family Medicine

## 2020-01-13 ENCOUNTER — Encounter: Payer: Self-pay | Admitting: Family Medicine

## 2020-01-21 NOTE — Patient Instructions (Addendum)
DUE TO COVID-19 ONLY ONE VISITOR IS ALLOWED IN WAITING ROOM (VISITOR WILL HAVE A TEMPERATURE CHECK ON ARRIVAL AND MUST WEAR A FACE MASK THE ENTIRE TIME.)  ONCE YOU ARE ADMITTED TO YOUR PRIVATE ROOM, THE SAME ONE VISITOR IS ALLOWED TO VISIT DURING VISITING HOURS ONLY.  Your COVID swab testing is scheduled for  At  Friday, 01-28-20 @ 12:30 PM  ,   You must self quarantine after your testing per handout given to you at the testing site.  Your test will be at [facility]. Your procedure is scheduled on:  Monday 01-31-20  Report to [facility] AT  5:30 A. M.   Call this number if you have problems the morning of surgery:[phone].   OUR ADDRESS IS [address]  WE ARE LOCATED IN [facility].                                     REMEMBER:  DO NOT EAT FOOD OR DRINK LIQUIDS AFTER MIDNIGHT .    BRUSH YOUR TEETH THE MORNING OF SURGERY.  TAKE THESE MEDICATIONS MORNING OF SURGERY WITH A SIP OF WATER:  Pantoprazole  DO NOT WEAR JEWERLY, MAKE UP, OR NAIL POLISH.  DO NOT WEAR LOTIONS, POWDERS, PERFUMES/COLOGNE OR DEODORANT.  DO NOT SHAVE FOR 24 HOURS PRIOR TO DAY OF SURGERY.  CONTACTS, GLASSES, OR DENTURES MAY NOT BE WORN TO SURGERY.                                    [facility] IS NOT RESPONSIBLE  FOR ANY BELONGINGS.                                                                     WHAT IS A BLOOD TRANSFUSION? Blood Transfusion Information  A transfusion is the replacement of blood or some of its parts. Blood is made up of multiple cells which provide different functions.  Red blood cells carry oxygen and are used for blood loss replacement.  White blood cells fight against infection.  Platelets control bleeding.  Plasma helps clot blood.  Other blood products are available for specialized needs, such as hemophilia or other clotting disorders. BEFORE THE TRANSFUSION  Who gives blood for transfusions?   Healthy volunteers who are fully  evaluated to make sure their blood is safe. This is blood bank blood. Transfusion therapy is the safest it has ever been in the practice of medicine. Before blood is taken from a donor, a complete history is taken to make sure that person has no history of diseases nor engages in risky social behavior (examples are intravenous drug use or sexual activity with multiple partners). The donor's travel history is screened to minimize risk of transmitting infections, such as malaria. The donated blood is tested for signs of infectious diseases, such as HIV and hepatitis. The blood is then tested to be sure it is compatible with you in order to minimize the chance of a transfusion reaction. If you or a relative donates blood, this is often done in anticipation of surgery  and is not appropriate for emergency situations. It takes many days to process the donated blood. RISKS AND COMPLICATIONS Although transfusion therapy is very safe and saves many lives, the main dangers of transfusion include:   Getting an infectious disease.  Developing a transfusion reaction. This is an allergic reaction to something in the blood you were given. Every precaution is taken to prevent this. The decision to have a blood transfusion has been considered carefully by your caregiver before blood is given. Blood is not given unless the benefits outweigh the risks. AFTER THE TRANSFUSION  Right after receiving a blood transfusion, you will usually feel much better and more energetic. This is especially true if your red blood cells have gotten low (anemic). The transfusion raises the level of the red blood cells which carry oxygen, and this usually causes an energy increase.  The nurse administering the transfusion will monitor you carefully for complications. HOME CARE INSTRUCTIONS  No special instructions are needed after a transfusion. You may find your energy is better. Speak with your caregiver about any limitations on activity  for underlying diseases you may have. SEEK MEDICAL CARE IF:   Your condition is not improving after your transfusion.  You develop redness or irritation at the intravenous (IV) site. SEEK IMMEDIATE MEDICAL CARE IF:  Any of the following symptoms occur over the next 12 hours:  Shaking chills.  You have a temperature by mouth above 102 F (38.9 C), not controlled by medicine.  Chest, back, or muscle pain.  People around you feel you are not acting correctly or are confused.  Shortness of breath or difficulty breathing.  Dizziness and fainting.  You get a rash or develop hives.  You have a decrease in urine output.  Your urine turns a dark color or changes to pink, red, or brown. Any of the following symptoms occur over the next 10 days:  You have a temperature by mouth above 102 F (38.9 C), not controlled by medicine.  Shortness of breath.  Weakness after normal activity.  The white part of the eye turns yellow (jaundice).  You have a decrease in the amount of urine or are urinating less often.  Your urine turns a dark color or changes to pink, red, or brown. Document Released: 01/26/2000 Document Revised: 04/22/2011 Document Reviewed: 09/14/2007 [facility] Patient Information 2014 [place], [place].  _______________________________________________________________________

## 2020-01-21 NOTE — Progress Notes (Addendum)
COVID Vaccine Completed:  x2 Date COVID Vaccine completed:  10-21-19 & 11-11-19 COVID vaccine manufacturer: [place]   PCP - Otilio Miu, MD Cardiologist - Esmond Plants, MD.  Last OV 2-19  Chest x-ray -  EKG - 01-24-20 in Epic Stress Test -  ECHO -  Cardiac Cath -  Pacemaker/ICD device last checked: Long term monitor - 09-09-17 in Epic   Sleep Study -  CPAP -   Fasting Blood Sugar -  Checks Blood Sugar _____ times a day  Blood Thinner Instructions: Aspirin Instructions: Last Dose:  Anesthesia review:  Hx of palpitations, tachycardia and chest pain.  Patient denies shortness of breath, fever, cough and chest pain at PAT appointment.  Pt able to climb a flight of stairs and perform ADLs independently.   Patient verbalized understanding of instructions that were given to them at the PAT appointment. Patient was also instructed that they will need to review over the PAT instructions again at home before surgery.

## 2020-01-21 NOTE — Progress Notes (Signed)
Please enter orders for PST appointment 01-24-20.

## 2020-01-24 ENCOUNTER — Encounter (HOSPITAL_COMMUNITY)
Admission: RE | Admit: 2020-01-24 | Discharge: 2020-01-24 | Disposition: A | Discharge status: Home / Self Care | Payer: 59 | Source: Ambulatory Visit | Attending: Obstetrics and Gynecology | Admitting: Obstetrics and Gynecology

## 2020-01-24 ENCOUNTER — Encounter (HOSPITAL_COMMUNITY): Payer: Self-pay

## 2020-01-24 ENCOUNTER — Other Ambulatory Visit: Payer: Self-pay

## 2020-01-24 DIAGNOSIS — D259 Leiomyoma of uterus, unspecified: Secondary | ICD-10-CM | POA: Diagnosis not present

## 2020-01-24 DIAGNOSIS — Z01818 Encounter for other preprocedural examination: Secondary | ICD-10-CM | POA: Insufficient documentation

## 2020-01-24 DIAGNOSIS — R102 Pelvic and perineal pain: Secondary | ICD-10-CM | POA: Diagnosis not present

## 2020-01-24 DIAGNOSIS — E8941 Symptomatic postprocedural ovarian failure: Secondary | ICD-10-CM | POA: Diagnosis not present

## 2020-01-24 DIAGNOSIS — N939 Abnormal uterine and vaginal bleeding, unspecified: Secondary | ICD-10-CM | POA: Diagnosis not present

## 2020-01-24 DIAGNOSIS — F419 Anxiety disorder, unspecified: Secondary | ICD-10-CM | POA: Diagnosis not present

## 2020-01-24 HISTORY — DX: Deficiency of other specified B group vitamins: E53.8

## 2020-01-24 HISTORY — DX: Migraine, unspecified, not intractable, without status migrainosus: G43.909

## 2020-01-24 HISTORY — DX: Pneumonia, unspecified organism: J18.9

## 2020-01-24 HISTORY — DX: Gastro-esophageal reflux disease without esophagitis: K21.9

## 2020-01-24 LAB — BASIC METABOLIC PANEL
Anion gap: 8 (ref 5–15)
BUN: 21 mg/dL — ABNORMAL HIGH (ref 6–20)
CO2: 24 mmol/L (ref 22–32)
Calcium: 8.8 mg/dL — ABNORMAL LOW (ref 8.9–10.3)
Chloride: 107 mmol/L (ref 98–111)
Creatinine, Ser: 0.58 mg/dL (ref 0.44–1.00)
GFR, Estimated: >60 mL/min (ref >60)
Glucose, Bld: 100 mg/dL — ABNORMAL HIGH (ref 70–99)
Potassium: 3.9 mmol/L (ref 3.5–5.1)
Sodium: 139 mmol/L (ref 135–145)

## 2020-01-24 LAB — CBC
HCT: 40.6 % (ref 36.0–46.0)
Hemoglobin: 13.5 g/dL (ref 12.0–15.0)
MCH: 33.8 pg (ref 26.0–34.0)
MCHC: 33.3 g/dL (ref 30.0–36.0)
MCV: 101.8 fL — ABNORMAL HIGH (ref 80.0–100.0)
Platelets: 231 10*3/uL (ref 150–400)
RBC: 3.99 MIL/uL (ref 3.87–5.11)
RDW: 12.6 % (ref 11.5–15.5)
WBC: 5.5 10*3/uL (ref 4.0–10.5)
nRBC: 0 % (ref 0.0–0.2)

## 2020-01-26 NOTE — H&P (Addendum)
Jaime Fletcher is an 49 y.o. female who presents for hysterectomy and planned LAVH/BS She has a 8 cm fibroid which is causing pelvic pain, dysparunia, AUB, and caused malposition of her IUD Recent FSH shows she is not menopausal She desires definitive surgical intervention and present for this    Past Medical History:  Diagnosis Date  . Abnormal Pap smear of cervix    dysplasia  . ADD (attention deficit disorder)   . B12 deficiency   . Fibroid   . GERD (gastroesophageal reflux disease)   . Migraine headache   . Pneumonia   . PTSD (post-traumatic stress disorder)   . PTSD (post-traumatic stress disorder)     Past Surgical History:  Procedure Laterality Date  . APPENDECTOMY    . AUGMENTATION MAMMAPLASTY Bilateral 2005  . cold knife bx    . DILATION AND CURETTAGE OF UTERUS    . SPINE SURGERY    . TONSILLECTOMY      Family History  Problem Relation Age of Onset  . Diabetes Mother   . Heart attack Mother        x2  . Heart disease Mother   . Hypertension Mother   . Breast cancer Paternal Aunt   . Heart failure Maternal Grandmother   . Atrial fibrillation Father   . Hypertension Father   . Ovarian cancer Neg Hx   . Colon cancer Neg Hx     Social History:  reports that she has never smoked. She has never used smokeless tobacco. She reports current alcohol use. She reports that she does not use drugs.  Allergies:  Allergies  Allergen Reactions  . Hydrocodone-Acetaminophen Nausea And Vomiting  . Prednisone Other (See Comments)    Insomnia, violence  . Amitriptyline Other (See Comments)    Tremors     No medications prior to admission.    Review of Systems  Last menstrual period 01/20/2020. Physical Exam   Constitutional: *General Appearance: healthy-appearing, well-nourished, and well-developed.  Head: Head: normocephalic.  Neck: *Thyroid: non-tender and no enlargement.  Lymph Nodes: *Palpation: normal.  Cardiovascular: *Auscultation: RRR and no  murmur. *Peripheral Vascular: no varicosities or edema.  Lungs: *Auscultation: clear to auscultation. Inspection: normal and normal respiratory rate.  *Breast: Bilateral: no tenderness, skin changes, abnormal nipple secretions, or masses palpable and nipple appearance: normal. Right Breast: normal. Left Breast: normal.  Abdomen: *Inspection/Palpation/Auscultation: no tenderness, rebound, guarding, hepatomegaly, or splenomegaly and non-distended and soft. *Hernia: none palpated.  Back: Appearance normal. Palpation no costovertebral angle tenderness.  Female Genitalia: Vulva: no masses, atrophy, or lesions. Mons: no erythema, excoriation, atrophy, lesions, masses, swelling, tenderness, or vesicles/ ulcers and normal. Labia Majora: no erythema, excoriation, atrophy, discoloration, lesions, masses, swelling, tenderness, or vesicles/ ulcers and normal. Labia Minora: no erythema, excoriation, atrophy, discoloration, lesions, vesicles, masses, swelling, or tenderness and normal. Introitus: normal. Bartholin's Gland: normal. *Vagina: no discharge, erythema, atrophy, lesions, ulcers, swelling, masses, tenderness, prolapse, or blood present and normal. *Cervix: no lesions, discharge, bleeding, or cervical motion tenderness and grossly normal. *Uterus: midline, mobile, non-tender, normal contour, no uterine prolapse, and enlarged 10 weeks size. *Urethral Meatus/ Urethra: no discharge, masses, or tenderness and normal meatus and well supported urethra. *Bladder: non-distended, non-tender, and no palpable mass. *Adnexa/Parametria: no tenderness or mass palpable.  Extremities: Legs: normal.  Skin: *Appearance: no rashes or lesions.  Psychiatric: *Orientation: to person, place, and time. *Mood and Affect: normal mood and affect and active and alert  No results found for this or any previous visit (from  the past 24 hour(s)).  No results found.  Assessment/Plan: Uterine fibroids, dysparunia, aub, pelvic pain,  and malposition of IUD Plan LAVH, BS possible TAH/BS We discussed the procedure at length.  The risks and benefits and alternative treatments were discussed.  Risks include, but not limited to, injury to bowel, bladder, uterus, tubes ovaries, risks associated with possible laparotomy, blood transfusion, and infection.  All of her questions were answered and she gives informed consent.  Luz Lex 01/26/2020, 5:46 PM  This patient has been seen and examined.   All of her questions were answered.  Labs and vital signs reviewed.  Informed consent has been obtained.  The History and Physical is current. 01/31/20 0715  DL

## 2020-01-28 ENCOUNTER — Other Ambulatory Visit: Payer: Self-pay

## 2020-01-28 ENCOUNTER — Other Ambulatory Visit
Admission: RE | Admit: 2020-01-28 | Discharge: 2020-01-28 | Disposition: A | Discharge status: Home / Self Care | Payer: 59 | Source: Ambulatory Visit | Attending: Obstetrics and Gynecology | Admitting: Obstetrics and Gynecology

## 2020-01-28 DIAGNOSIS — Z20822 Contact with and (suspected) exposure to covid-19: Secondary | ICD-10-CM | POA: Diagnosis not present

## 2020-01-28 DIAGNOSIS — Z01812 Encounter for preprocedural laboratory examination: Secondary | ICD-10-CM | POA: Insufficient documentation

## 2020-01-29 LAB — SARS CORONAVIRUS 2 (TAT 6-24 HRS): SARS Coronavirus 2: NEGATIVE

## 2020-01-31 ENCOUNTER — Observation Stay (HOSPITAL_BASED_OUTPATIENT_CLINIC_OR_DEPARTMENT_OTHER)
Admission: RE | Admit: 2020-01-31 | Discharge: 2020-02-01 | Disposition: A | Discharge status: Home / Self Care | Payer: 59 | Attending: Obstetrics and Gynecology | Admitting: Obstetrics and Gynecology

## 2020-01-31 ENCOUNTER — Encounter (HOSPITAL_BASED_OUTPATIENT_CLINIC_OR_DEPARTMENT_OTHER): Payer: Self-pay | Admitting: Obstetrics and Gynecology

## 2020-01-31 ENCOUNTER — Observation Stay (HOSPITAL_BASED_OUTPATIENT_CLINIC_OR_DEPARTMENT_OTHER): Payer: 59 | Admitting: Physician Assistant

## 2020-01-31 ENCOUNTER — Encounter (HOSPITAL_BASED_OUTPATIENT_CLINIC_OR_DEPARTMENT_OTHER)
Admission: RE | Disposition: A | Discharge status: Home / Self Care | Payer: Self-pay | Source: Home / Self Care | Attending: Obstetrics and Gynecology

## 2020-01-31 ENCOUNTER — Observation Stay (HOSPITAL_BASED_OUTPATIENT_CLINIC_OR_DEPARTMENT_OTHER): Payer: 59 | Admitting: Certified Registered"

## 2020-01-31 DIAGNOSIS — Z90710 Acquired absence of both cervix and uterus: Secondary | ICD-10-CM | POA: Diagnosis present

## 2020-01-31 DIAGNOSIS — D259 Leiomyoma of uterus, unspecified: Secondary | ICD-10-CM | POA: Diagnosis not present

## 2020-01-31 DIAGNOSIS — E559 Vitamin D deficiency, unspecified: Secondary | ICD-10-CM | POA: Diagnosis not present

## 2020-01-31 DIAGNOSIS — Y762 Prosthetic and other implants, materials and accessory obstetric and gynecological devices associated with adverse incidents: Secondary | ICD-10-CM | POA: Diagnosis not present

## 2020-01-31 DIAGNOSIS — T8332XA Displacement of intrauterine contraceptive device, initial encounter: Secondary | ICD-10-CM | POA: Insufficient documentation

## 2020-01-31 DIAGNOSIS — N941 Unspecified dyspareunia: Secondary | ICD-10-CM | POA: Diagnosis not present

## 2020-01-31 DIAGNOSIS — F418 Other specified anxiety disorders: Secondary | ICD-10-CM | POA: Diagnosis not present

## 2020-01-31 DIAGNOSIS — R102 Pelvic and perineal pain: Secondary | ICD-10-CM | POA: Diagnosis not present

## 2020-01-31 DIAGNOSIS — N939 Abnormal uterine and vaginal bleeding, unspecified: Secondary | ICD-10-CM | POA: Diagnosis not present

## 2020-01-31 DIAGNOSIS — D250 Submucous leiomyoma of uterus: Secondary | ICD-10-CM | POA: Diagnosis not present

## 2020-01-31 DIAGNOSIS — D251 Intramural leiomyoma of uterus: Secondary | ICD-10-CM | POA: Diagnosis not present

## 2020-01-31 HISTORY — PX: LAPAROSCOPIC VAGINAL HYSTERECTOMY WITH SALPINGECTOMY: SHX6680

## 2020-01-31 HISTORY — PX: ABDOMINAL HYSTERECTOMY: SHX81

## 2020-01-31 LAB — TYPE AND SCREEN
ABO/RH(D): O POS
ABO/RH(D): O POS
Antibody Screen: NEGATIVE
Antibody Screen: NEGATIVE

## 2020-01-31 LAB — POCT PREGNANCY, URINE: Preg Test, Ur: NEGATIVE

## 2020-01-31 SURGERY — HYSTERECTOMY, VAGINAL, LAPAROSCOPY-ASSISTED, WITH SALPINGECTOMY
Anesthesia: General | Laterality: Bilateral

## 2020-01-31 MED ORDER — OXYCODONE HCL 5 MG PO TABS
ORAL_TABLET | ORAL | Status: AC
  Filled 2020-01-31: qty 2

## 2020-01-31 MED ORDER — KETOROLAC TROMETHAMINE 30 MG/ML IJ SOLN
INTRAMUSCULAR | Status: AC
  Filled 2020-01-31: qty 1

## 2020-01-31 MED ORDER — PROMETHAZINE HCL 25 MG/ML IJ SOLN: 6.2500 mg | INTRAMUSCULAR | Status: DC | PRN

## 2020-01-31 MED ORDER — ACETAMINOPHEN 10 MG/ML IV SOLN
INTRAVENOUS | Status: AC
  Filled 2020-01-31: qty 100

## 2020-01-31 MED ORDER — SODIUM CHLORIDE 0.9 % IR SOLN
Status: DC | PRN
  Administered 2020-01-31: 1000 mL

## 2020-01-31 MED ORDER — ROCURONIUM BROMIDE 10 MG/ML (PF) SYRINGE
PREFILLED_SYRINGE | INTRAVENOUS | Status: AC
  Filled 2020-01-31: qty 10

## 2020-01-31 MED ORDER — ONDANSETRON HCL 4 MG/2ML IJ SOLN
INTRAMUSCULAR | Status: DC | PRN
  Administered 2020-01-31 (×2): 4 mg via INTRAVENOUS

## 2020-01-31 MED ORDER — CHLORHEXIDINE GLUCONATE 0.12 % MT SOLN: 15.0000 mL | Freq: Once | OROMUCOSAL | Status: DC

## 2020-01-31 MED ORDER — OXYCODONE HCL 5 MG PO TABS
5.0000 mg | ORAL_TABLET | ORAL | Status: DC | PRN
  Administered 2020-01-31 – 2020-02-01 (×6): 10 mg via ORAL

## 2020-01-31 MED ORDER — HYDROMORPHONE HCL 1 MG/ML IJ SOLN
INTRAMUSCULAR | Status: AC
  Filled 2020-01-31: qty 1

## 2020-01-31 MED ORDER — HYDROMORPHONE HCL 1 MG/ML IJ SOLN: 0.2000 mg | INTRAMUSCULAR | Status: DC | PRN

## 2020-01-31 MED ORDER — DEXMEDETOMIDINE (PRECEDEX) IN NS 20 MCG/5ML (4 MCG/ML) IV SYRINGE
PREFILLED_SYRINGE | INTRAVENOUS | Status: DC | PRN
  Administered 2020-01-31 (×4): 4 ug via INTRAVENOUS

## 2020-01-31 MED ORDER — SODIUM CHLORIDE 0.9 % IV SOLN
2.0000 g | INTRAVENOUS | Status: AC
  Administered 2020-01-31: 2 g via INTRAVENOUS

## 2020-01-31 MED ORDER — EPHEDRINE SULFATE-NACL 50-0.9 MG/10ML-% IV SOSY
PREFILLED_SYRINGE | INTRAVENOUS | Status: DC | PRN
  Administered 2020-01-31 (×4): 5 mg via INTRAVENOUS

## 2020-01-31 MED ORDER — PROPOFOL 10 MG/ML IV BOLUS
INTRAVENOUS | Status: AC
  Filled 2020-01-31: qty 40

## 2020-01-31 MED ORDER — HYDROMORPHONE HCL 2 MG/ML IJ SOLN
INTRAMUSCULAR | Status: AC
  Filled 2020-01-31: qty 1

## 2020-01-31 MED ORDER — DIPHENHYDRAMINE HCL 50 MG/ML IJ SOLN
INTRAMUSCULAR | Status: DC | PRN
  Administered 2020-01-31: 12.5 mg via INTRAVENOUS

## 2020-01-31 MED ORDER — LORAZEPAM 2 MG/ML IJ SOLN
0.5000 mg | INTRAMUSCULAR | Status: DC | PRN
  Administered 2020-01-31: 0.5 mg via INTRAVENOUS
  Filled 2020-01-31: qty 0.25

## 2020-01-31 MED ORDER — SIMETHICONE 80 MG PO CHEW: 80.0000 mg | CHEWABLE_TABLET | Freq: Four times a day (QID) | ORAL | Status: DC | PRN

## 2020-01-31 MED ORDER — MENTHOL 3 MG MT LOZG: 1.0000 | LOZENGE | OROMUCOSAL | Status: DC | PRN

## 2020-01-31 MED ORDER — POVIDONE-IODINE 10 % EX SWAB: 2.0000 "application " | Freq: Once | CUTANEOUS | Status: DC

## 2020-01-31 MED ORDER — ZOLPIDEM TARTRATE 5 MG PO TABS: 5.0000 mg | ORAL_TABLET | Freq: Every evening | ORAL | Status: DC | PRN

## 2020-01-31 MED ORDER — ORAL CARE MOUTH RINSE: 15.0000 mL | Freq: Once | OROMUCOSAL | Status: DC

## 2020-01-31 MED ORDER — ACETAMINOPHEN 10 MG/ML IV SOLN
1000.0000 mg | Freq: Once | INTRAVENOUS | Status: DC | PRN
  Administered 2020-01-31: 1000 mg via INTRAVENOUS

## 2020-01-31 MED ORDER — PHENYLEPHRINE 40 MCG/ML (10ML) SYRINGE FOR IV PUSH (FOR BLOOD PRESSURE SUPPORT)
PREFILLED_SYRINGE | INTRAVENOUS | Status: DC | PRN
  Administered 2020-01-31 (×3): 40 ug via INTRAVENOUS
  Administered 2020-01-31: 80 ug via INTRAVENOUS
  Administered 2020-01-31 (×3): 40 ug via INTRAVENOUS
  Administered 2020-01-31: 80 ug via INTRAVENOUS

## 2020-01-31 MED ORDER — ACETAMINOPHEN 325 MG PO TABS
650.0000 mg | ORAL_TABLET | ORAL | Status: DC | PRN
  Administered 2020-01-31: 325 mg via ORAL
  Administered 2020-02-01: 650 mg via ORAL
  Administered 2020-02-01: 325 mg via ORAL

## 2020-01-31 MED ORDER — SCOPOLAMINE 1 MG/3DAYS TD PT72
MEDICATED_PATCH | TRANSDERMAL | Status: DC | PRN
  Administered 2020-01-31: 1 via TRANSDERMAL

## 2020-01-31 MED ORDER — LIDOCAINE 2% (20 MG/ML) 5 ML SYRINGE
INTRAMUSCULAR | Status: DC | PRN
  Administered 2020-01-31: 100 mg via INTRAVENOUS

## 2020-01-31 MED ORDER — FENTANYL CITRATE (PF) 250 MCG/5ML IJ SOLN
INTRAMUSCULAR | Status: AC
  Filled 2020-01-31: qty 5

## 2020-01-31 MED ORDER — HYDROMORPHONE HCL 1 MG/ML IJ SOLN
INTRAMUSCULAR | Status: DC | PRN
  Administered 2020-01-31 (×4): .5 mg via INTRAVENOUS

## 2020-01-31 MED ORDER — MIDAZOLAM HCL 5 MG/5ML IJ SOLN
INTRAMUSCULAR | Status: DC | PRN
  Administered 2020-01-31: 2 mg via INTRAVENOUS

## 2020-01-31 MED ORDER — DEXMEDETOMIDINE (PRECEDEX) IN NS 20 MCG/5ML (4 MCG/ML) IV SYRINGE
PREFILLED_SYRINGE | INTRAVENOUS | Status: AC
  Filled 2020-01-31: qty 5

## 2020-01-31 MED ORDER — MEPERIDINE HCL 25 MG/ML IJ SOLN: 6.2500 mg | INTRAMUSCULAR | Status: DC | PRN

## 2020-01-31 MED ORDER — KETOROLAC TROMETHAMINE 30 MG/ML IJ SOLN
30.0000 mg | Freq: Once | INTRAMUSCULAR | Status: AC | PRN
  Administered 2020-01-31: 30 mg via INTRAVENOUS

## 2020-01-31 MED ORDER — SCOPOLAMINE 1 MG/3DAYS TD PT72
MEDICATED_PATCH | TRANSDERMAL | Status: AC
  Filled 2020-01-31: qty 1

## 2020-01-31 MED ORDER — ONDANSETRON 4 MG PO TBDP
8.0000 mg | ORAL_TABLET | Freq: Three times a day (TID) | ORAL | Status: DC | PRN
  Administered 2020-01-31 – 2020-02-01 (×2): 8 mg via ORAL

## 2020-01-31 MED ORDER — ACETAMINOPHEN 325 MG PO TABS
ORAL_TABLET | ORAL | Status: AC
  Filled 2020-01-31: qty 1

## 2020-01-31 MED ORDER — PROPOFOL 10 MG/ML IV BOLUS
INTRAVENOUS | Status: DC | PRN
  Administered 2020-01-31: 200 mg via INTRAVENOUS

## 2020-01-31 MED ORDER — SODIUM CHLORIDE 0.9 % IV SOLN
INTRAVENOUS | Status: AC
  Filled 2020-01-31: qty 2

## 2020-01-31 MED ORDER — HYDROMORPHONE HCL 1 MG/ML IJ SOLN
0.2500 mg | INTRAMUSCULAR | Status: DC | PRN
  Administered 2020-01-31 (×4): 0.5 mg via INTRAVENOUS

## 2020-01-31 MED ORDER — MIDAZOLAM HCL 2 MG/2ML IJ SOLN
INTRAMUSCULAR | Status: AC
  Filled 2020-01-31: qty 2

## 2020-01-31 MED ORDER — ALUM & MAG HYDROXIDE-SIMETH 200-200-20 MG/5ML PO SUSP: 30.0000 mL | ORAL | Status: DC | PRN

## 2020-01-31 MED ORDER — ONDANSETRON HCL 4 MG/2ML IJ SOLN
INTRAMUSCULAR | Status: AC
  Filled 2020-01-31: qty 4

## 2020-01-31 MED ORDER — DEXTROSE-NACL 5-0.45 % IV SOLN
INTRAVENOUS | Status: DC
  Administered 2020-01-31 (×2): 1 mL via INTRAVENOUS

## 2020-01-31 MED ORDER — LIDOCAINE HCL (PF) 2 % IJ SOLN
INTRAMUSCULAR | Status: AC
  Filled 2020-01-31: qty 5

## 2020-01-31 MED ORDER — SUGAMMADEX SODIUM 200 MG/2ML IV SOLN
INTRAVENOUS | Status: DC | PRN
  Administered 2020-01-31: 125 mg via INTRAVENOUS

## 2020-01-31 MED ORDER — DIPHENHYDRAMINE HCL 50 MG/ML IJ SOLN
INTRAMUSCULAR | Status: AC
  Filled 2020-01-31: qty 1

## 2020-01-31 MED ORDER — BUPIVACAINE HCL (PF) 0.25 % IJ SOLN
INTRAMUSCULAR | Status: DC | PRN
  Administered 2020-01-31: 10 mL

## 2020-01-31 MED ORDER — LACTATED RINGERS IV SOLN: INTRAVENOUS | Status: DC

## 2020-01-31 MED ORDER — ONDANSETRON 4 MG PO TBDP
ORAL_TABLET | ORAL | Status: AC
  Filled 2020-01-31: qty 2

## 2020-01-31 MED ORDER — ROCURONIUM BROMIDE 100 MG/10ML IV SOLN
INTRAVENOUS | Status: DC | PRN
  Administered 2020-01-31: 70 mg via INTRAVENOUS

## 2020-01-31 MED ORDER — FENTANYL CITRATE (PF) 100 MCG/2ML IJ SOLN
INTRAMUSCULAR | Status: DC | PRN
  Administered 2020-01-31 (×3): 50 ug via INTRAVENOUS

## 2020-01-31 SURGICAL SUPPLY — 62 items
ADH SKN CLS APL DERMABOND .7 (GAUZE/BANDAGES/DRESSINGS) ×1
APL SRG 38 LTWT LNG FL B (MISCELLANEOUS) ×1
APPLICATOR ARISTA FLEXITIP XL (MISCELLANEOUS) ×2 IMPLANT
BLADE CLIPPER SENSICLIP SURGIC (BLADE) IMPLANT
BLADE SURG 15 STRL LF DISP TIS (BLADE) ×1 IMPLANT
BLADE SURG 15 STRL SS (BLADE) ×2
BNDG ADH 1X3 SHEER STRL LF (GAUZE/BANDAGES/DRESSINGS) ×2 IMPLANT
BNDG ADH THN 3X1 STRL LF (GAUZE/BANDAGES/DRESSINGS) ×1
CABLE HIGH FREQUENCY MONO STRZ (ELECTRODE) IMPLANT
CATH ROBINSON RED A/P 16FR (CATHETERS) ×2 IMPLANT
COVER BACK TABLE 60X90IN (DRAPES) ×2 IMPLANT
COVER MAYO STAND STRL (DRAPES) ×2 IMPLANT
COVER WAND RF STERILE (DRAPES) ×2 IMPLANT
DECANTER SPIKE VIAL GLASS SM (MISCELLANEOUS) IMPLANT
DERMABOND ADVANCED (GAUZE/BANDAGES/DRESSINGS) ×1
DERMABOND ADVANCED .7 DNX12 (GAUZE/BANDAGES/DRESSINGS) ×1 IMPLANT
DRSG OPSITE POSTOP 3X4 (GAUZE/BANDAGES/DRESSINGS) ×2 IMPLANT
DURAPREP 26ML APPLICATOR (WOUND CARE) ×2 IMPLANT
ELECT REM PT RETURN 9FT ADLT (ELECTROSURGICAL) ×2
ELECTRODE REM PT RTRN 9FT ADLT (ELECTROSURGICAL) ×1 IMPLANT
FORCEPS CUTTING 33CM 5MM (CUTTING FORCEPS) IMPLANT
FORCEPS CUTTING 45CM 5MM (CUTTING FORCEPS) ×2 IMPLANT
GAUZE 4X4 16PLY RFD (DISPOSABLE) ×2 IMPLANT
GLOVE BIO SURGEON STRL SZ8 (GLOVE) ×2 IMPLANT
GLOVE BIOGEL PI IND STRL 6.5 (GLOVE) ×4 IMPLANT
GLOVE BIOGEL PI IND STRL 7.0 (GLOVE) ×3 IMPLANT
GLOVE BIOGEL PI INDICATOR 6.5 (GLOVE) ×4
GLOVE BIOGEL PI INDICATOR 7.0 (GLOVE) ×3
GLOVE SURG ORTHO 8.0 STRL STRW (GLOVE) ×6 IMPLANT
HEMOSTAT ARISTA ABSORB 3G PWDR (HEMOSTASIS) ×2 IMPLANT
HOLDER FOLEY CATH W/STRAP (MISCELLANEOUS) ×2 IMPLANT
KIT TURNOVER CYSTO (KITS) ×2 IMPLANT
LIGASURE IMPACT 36 18CM CVD LR (INSTRUMENTS) ×2 IMPLANT
LIGASURE LAP L-HOOKWIRE 5 44CM (INSTRUMENTS) IMPLANT
NEEDLE INSUFFLATION 120MM (ENDOMECHANICALS) ×2 IMPLANT
NS IRRIG 500ML POUR BTL (IV SOLUTION) ×2 IMPLANT
PACK LAVH (CUSTOM PROCEDURE TRAY) ×2 IMPLANT
PACK ROBOTIC GOWN (GOWN DISPOSABLE) ×2 IMPLANT
PACK TRENDGUARD 450 HYBRID PRO (MISCELLANEOUS) ×1 IMPLANT
PAD OB MATERNITY 4.3X12.25 (PERSONAL CARE ITEMS) ×2 IMPLANT
PAD PREP 24X48 CUFFED NSTRL (MISCELLANEOUS) ×2 IMPLANT
SCISSORS LAP 5X35 DISP (ENDOMECHANICALS) IMPLANT
SEALER TISSUE G2 CVD JAW 45CM (ENDOMECHANICALS) IMPLANT
SET SUCTION IRRIG HYDROSURG (IRRIGATION / IRRIGATOR) IMPLANT
SOLUTION ELECTROLUBE (MISCELLANEOUS) ×2 IMPLANT
STRIP CLOSURE SKIN 1/4X4 (GAUZE/BANDAGES/DRESSINGS) ×2 IMPLANT
SUT MNCRL 0 MO-4 VIOLET 18 CR (SUTURE) ×2 IMPLANT
SUT MNCRL 0 VIOLET 6X18 (SUTURE) ×1 IMPLANT
SUT MNCRL AB 0 CT1 27 (SUTURE) ×2 IMPLANT
SUT MON AB 2-0 CT1 36 (SUTURE) ×2 IMPLANT
SUT MONOCRYL 0 6X18 (SUTURE) ×1
SUT MONOCRYL 0 MO 4 18  CR/8 (SUTURE) ×2
SUT VICRYL 0 UR6 27IN ABS (SUTURE) ×2 IMPLANT
SUT VICRYL RAPIDE 3 0 (SUTURE) ×2 IMPLANT
SYR BULB IRRIG 60ML STRL (SYRINGE) IMPLANT
TOWEL OR 17X26 10 PK STRL BLUE (TOWEL DISPOSABLE) ×4 IMPLANT
TRAY FOLEY W/BAG SLVR 14FR LF (SET/KITS/TRAYS/PACK) ×2 IMPLANT
TRENDGUARD 450 HYBRID PRO PACK (MISCELLANEOUS) ×2
TROCAR OPTI TIP 5M 100M (ENDOMECHANICALS) ×2 IMPLANT
TROCAR XCEL DIL TIP R 11M (ENDOMECHANICALS) ×2 IMPLANT
WARMER LAPAROSCOPE (MISCELLANEOUS) ×2 IMPLANT
WATER STERILE IRR 500ML POUR (IV SOLUTION) ×2 IMPLANT

## 2020-01-31 NOTE — Anesthesia Postprocedure Evaluation (Signed)
Anesthesia Post Note  Patient: Jaime Fletcher  Procedure(s) Performed: LAPAROSCOPIC ASSISTED VAGINAL HYSTERECTOMY WITH BILATERAL SALPINGECTOMY IUD REMOVAL (Bilateral )     Patient location during evaluation: PACU Anesthesia Type: General Level of consciousness: awake and sedated Pain management: pain level controlled Vital Signs Assessment: post-procedure vital signs reviewed and stable Respiratory status: spontaneous breathing Cardiovascular status: stable Postop Assessment: no apparent nausea or vomiting Anesthetic complications: no   No complications documented.  Last Vitals:  Vitals:   01/31/20 1030 01/31/20 1045  BP: (!) 88/74 118/64  Pulse: 100 (!) 119  Resp: 19 19  Temp:    SpO2: 97% 96%    Last Pain:  Vitals:   01/31/20 1045  TempSrc:   PainSc: 6    Pain Goal: Patients Stated Pain Goal: 3 (01/31/20 5844)                 Huston Foley

## 2020-01-31 NOTE — Anesthesia Procedure Notes (Signed)
Procedure Name: Intubation Date/Time: 01/31/2020 7:40 AM Performed by: Gwyndolyn Saxon, CRNA Pre-anesthesia Checklist: Patient identified, Emergency Drugs available, Suction available and Patient being monitored Patient Re-evaluated:Patient Re-evaluated prior to induction Oxygen Delivery Method: Circle system utilized Preoxygenation: Pre-oxygenation with 100% oxygen Induction Type: IV induction Ventilation: Mask ventilation without difficulty Laryngoscope Size: Miller and 2 Grade View: Grade I Tube type: Oral Tube size: 7.0 mm Number of attempts: 1 Airway Equipment and Method: Patient positioned with wedge pillow and Stylet Placement Confirmation: ETT inserted through vocal cords under direct vision,  positive ETCO2 and breath sounds checked- equal and bilateral Secured at: 20 cm Tube secured with: Tape Dental Injury: Teeth and Oropharynx as per pre-operative assessment

## 2020-01-31 NOTE — Progress Notes (Signed)
Dr Corinna Capra called updated on pt status.

## 2020-01-31 NOTE — Transfer of Care (Signed)
Immediate Anesthesia Transfer of Care Note  Patient: Jaime Fletcher  Procedure(s) Performed: LAPAROSCOPIC ASSISTED VAGINAL HYSTERECTOMY WITH BILATERAL SALPINGECTOMY IUD REMOVAL (Bilateral )  Patient Location: PACU  Anesthesia Type:General  Level of Consciousness: awake, alert  and oriented  Airway & Oxygen Therapy: Patient Spontanous Breathing and Patient connected to face mask oxygen  Post-op Assessment: Report given to RN and Post -op Vital signs reviewed and stable  Post vital signs: Reviewed and stable  Last Vitals:  Vitals Value Taken Time  BP 110/64 01/31/20 0937  Temp    Pulse 113 01/31/20 0940  Resp 19 01/31/20 0940  SpO2 100 % 01/31/20 0940  Vitals shown include unvalidated device data.  Last Pain:  Vitals:   01/31/20 0613  TempSrc: Oral  PainSc: 0-No pain      Patients Stated Pain Goal: 3 (81/15/72 6203)  Complications: No complications documented.

## 2020-01-31 NOTE — Anesthesia Preprocedure Evaluation (Signed)
Anesthesia Evaluation  Patient identified by MRN, date of birth, ID band Patient awake    Reviewed: Allergy & Precautions, NPO status , Patient's Chart, lab work & pertinent test results  History of Anesthesia Complications (+) PONV  Airway Mallampati: I       Dental no notable dental hx.    Pulmonary    Pulmonary exam normal        Cardiovascular Normal cardiovascular exam     Neuro/Psych PSYCHIATRIC DISORDERS Anxiety Depression    GI/Hepatic Neg liver ROS, GERD  Medicated and Controlled,  Endo/Other  negative endocrine ROS  Renal/GU negative Renal ROS  negative genitourinary   Musculoskeletal   Abdominal Normal abdominal exam  (+)   Peds  Hematology negative hematology ROS (+)   Anesthesia Other Findings   Reproductive/Obstetrics                             Anesthesia Physical Anesthesia Plan  ASA: II  Anesthesia Plan: General   Post-op Pain Management:    Induction: Intravenous  PONV Risk Score and Plan: 4 or greater and Ondansetron, Dexamethasone and Scopolamine patch - Pre-op  Airway Management Planned: Oral ETT  Additional Equipment: None  Intra-op Plan:   Post-operative Plan: Extubation in OR  Informed Consent: I have reviewed the patients History and Physical, chart, labs and discussed the procedure including the risks, benefits and alternatives for the proposed anesthesia with the patient or authorized representative who has indicated his/her understanding and acceptance.     Dental advisory given  Plan Discussed with: CRNA  Anesthesia Plan Comments:         Anesthesia Quick Evaluation

## 2020-01-31 NOTE — Progress Notes (Signed)
Brief op note Preop/Postop:  Fibroids, aub, pelvic pain Procedure  LAVH/BS Surgeon Corinna Capra  Asst;  [facility] EBL 643DT Complications none Foley to straight drain Findings:  10 weeks size uterus, normal liver and ovaries DL

## 2020-01-31 NOTE — OR Nursing (Signed)
IUD removed by Dr. Corinna Capra in [place] #3.

## 2020-02-01 ENCOUNTER — Encounter (HOSPITAL_BASED_OUTPATIENT_CLINIC_OR_DEPARTMENT_OTHER): Payer: Self-pay | Admitting: Obstetrics and Gynecology

## 2020-02-01 DIAGNOSIS — D259 Leiomyoma of uterus, unspecified: Secondary | ICD-10-CM | POA: Diagnosis not present

## 2020-02-01 DIAGNOSIS — N941 Unspecified dyspareunia: Secondary | ICD-10-CM | POA: Diagnosis not present

## 2020-02-01 DIAGNOSIS — T8332XA Displacement of intrauterine contraceptive device, initial encounter: Secondary | ICD-10-CM | POA: Diagnosis not present

## 2020-02-01 LAB — CBC
HCT: 36.5 % (ref 36.0–46.0)
Hemoglobin: 11.9 g/dL — ABNORMAL LOW (ref 12.0–15.0)
MCH: 30.7 pg (ref 26.0–34.0)
MCHC: 32.6 g/dL (ref 30.0–36.0)
MCV: 94.3 fL (ref 80.0–100.0)
Platelets: 203 10*3/uL (ref 150–400)
RBC: 3.87 MIL/uL (ref 3.87–5.11)
RDW: 12.5 % (ref 11.5–15.5)
WBC: 14.8 10*3/uL — ABNORMAL HIGH (ref 4.0–10.5)
nRBC: 0 % (ref 0.0–0.2)

## 2020-02-01 LAB — SURGICAL PATHOLOGY

## 2020-02-01 MED ORDER — IBUPROFEN 600 MG PO TABS: 600.0000 mg | ORAL_TABLET | Freq: Four times a day (QID) | ORAL | 0 refills | Status: AC | PRN

## 2020-02-01 MED ORDER — ONDANSETRON 4 MG PO TBDP
ORAL_TABLET | ORAL | Status: AC
  Filled 2020-02-01: qty 2

## 2020-02-01 MED ORDER — ACETAMINOPHEN 325 MG PO TABS
ORAL_TABLET | ORAL | Status: AC
  Filled 2020-02-01: qty 1

## 2020-02-01 MED ORDER — KETOROLAC TROMETHAMINE 30 MG/ML IJ SOLN
INTRAMUSCULAR | Status: AC
  Filled 2020-02-01: qty 1

## 2020-02-01 MED ORDER — ACETAMINOPHEN 325 MG PO TABS
ORAL_TABLET | ORAL | Status: AC
  Filled 2020-02-01: qty 2

## 2020-02-01 MED ORDER — KETOROLAC TROMETHAMINE 30 MG/ML IJ SOLN
30.0000 mg | Freq: Once | INTRAMUSCULAR | Status: AC
  Administered 2020-02-01: 30 mg via INTRAVENOUS

## 2020-02-01 MED ORDER — OXYCODONE HCL 5 MG PO TABS
ORAL_TABLET | ORAL | Status: AC
  Filled 2020-02-01: qty 2

## 2020-02-01 MED ORDER — OXYCODONE-ACETAMINOPHEN 5-325 MG PO TABS: 1.0000 | ORAL_TABLET | Freq: Four times a day (QID) | ORAL | 0 refills | Status: DC | PRN

## 2020-02-01 MED ORDER — ONDANSETRON 8 MG PO TBDP: 8.0000 mg | ORAL_TABLET | Freq: Three times a day (TID) | ORAL | 0 refills | Status: DC | PRN

## 2020-02-01 NOTE — Discharge Summary (Signed)
Physician Discharge Summary  Patient ID: Jaime Fletcher MRN: [id number] DOB/AGE: 09/20/1970 49 y.o.  Admit date: 01/31/2020 Discharge date: 02/01/2020  Admission Diagnoses: Pelvic pain, fibroids, aub, dysparunia, malposition of IUD  Discharge Diagnoses: Same Active Problems:   S/P laparoscopic assisted vaginal hysterectomy (LAVH)   Discharged Condition: good  Hospital Course: Jaime Fletcher underwent and uncomplicated LAVH/BS with EBL 100cc.  Large fibroids noted intraop.  Her postop course was unremarkable with quick return of bowel and bladder.  Pain well controlled on oral meds. Post op Hgb 11.9  Consults: None  Significant Diagnostic Studies: labs: 11.9 post op hgb  Treatments: surgery: LAVH,BS  Discharge Exam: Blood pressure (!) 95/46, pulse 93, temperature 99 F (37.2 C), resp. rate 18, height 5\' 1"  (1.549 m), weight 62.9 kg, last menstrual period 01/20/2020, SpO2 100 %. General appearance: alert, cooperative, appears stated age and no distress GI: soft, non-tender; bowel sounds normal; no masses,  no organomegaly Incision/Wound:CD&I  Disposition: Discharge disposition: 01-Home or Self Care       Discharge Instructions    Call MD for:  difficulty breathing, headache or visual disturbances   Complete by: As directed    Call MD for:  persistant nausea and vomiting   Complete by: As directed    Call MD for:  redness, tenderness, or signs of infection (pain, swelling, redness, odor or green/yellow discharge around incision site)   Complete by: As directed    Call MD for:  severe uncontrolled pain   Complete by: As directed    Call MD for:  temperature >100.4   Complete by: As directed    Diet general   Complete by: As directed    Driving Restrictions   Complete by: As directed    No driving for 2 weeks   Increase activity slowly   Complete by: As directed    Lifting restrictions   Complete by: As directed    No lifting anything greater than 10 pounds (if you have  to ask, don't lift it)   Remove dressing in 48 hours   Complete by: As directed    Sexual Activity Restrictions   Complete by: As directed    Nothing in the vagina for 6 weeks     Allergies as of 02/01/2020      Reactions   Hydrocodone-acetaminophen Nausea And Vomiting   Prednisone Other (See Comments)   Insomnia, violence   Amitriptyline Other (See Comments)   Tremors       Medication List    STOP taking these medications   levonorgestrel 20 MCG/24HR IUD Commonly known as: MIRENA     TAKE these medications   Advil PM 200-25 MG Caps Generic drug: Ibuprofen-diphenhydrAMINE HCl Take 1 tablet by mouth at bedtime as needed (sleep).   cyanocobalamin 1000 MCG/ML injection Commonly known as: (VITAMIN B-12) Inject 1 mL (1,000 mcg total) into the muscle every 30 (thirty) days.   diphenhydramine-acetaminophen 25-500 MG Tabs tablet Commonly known as: TYLENOL PM Take 1 tablet by mouth at bedtime as needed.   fluticasone 50 MCG/ACT nasal spray Commonly known as: FLONASE INSTILL 2 SPRAYS INTO EACH NOSTRIL DAILYAT BEDTIME What changed:   how much to take  how to take this  when to take this  reasons to take this  additional instructions   hydrOXYzine 50 MG tablet Commonly known as: ATARAX/VISTARIL Take 25-50 mg by mouth 2 (two) times daily as needed for anxiety.   ibuprofen 600 MG tablet Commonly known as: ADVIL Take 1 tablet (600 mg  total) by mouth every 6 (six) hours as needed for mild pain or moderate pain.   L-THEANINE PO Take 0.5 drops by mouth at bedtime as needed (rest/anxiousness). 0.5 dropper   MAGNESIUM PO Take 325 mg by mouth every evening. Powder   NON FORMULARY Take 80 mg by mouth at bedtime as needed (sleep.). Silexan (Lavendar Essential Oil)   ondansetron 8 MG disintegrating tablet Commonly known as: Zofran ODT Take 1 tablet (8 mg total) by mouth every 8 (eight) hours as needed for nausea or vomiting.   OVER THE COUNTER MEDICATION Take 2  tablets by mouth at bedtime. Vicks ZZZQuil Pure Zzzs DeStress & Sleep   oxyCODONE-acetaminophen 5-325 MG tablet Commonly known as: Percocet Take 1-2 tablets by mouth every 6 (six) hours as needed for severe pain.   pantoprazole 40 MG tablet Commonly known as: PROTONIX Take 1 tablet (40 mg total) by mouth daily. What changed: when to take this   Probiotic Pearls Caps Take by mouth.   propranolol 10 MG tablet Commonly known as: INDERAL Take 10 mg by mouth daily as needed for anxiety.   traZODone 50 MG tablet Commonly known as: DESYREL Take 50 mg by mouth at bedtime as needed for sleep.   Vitamin D (Ergocalciferol) 1.25 MG (50000 UNIT) Caps capsule Commonly known as: DRISDOL Take 50,000 Units by mouth every Wednesday.        Signed: Luz Lex 02/01/2020, 9:09 AM

## 2020-02-01 NOTE — Discharge Instructions (Signed)
Laparoscopically Assisted Vaginal Hysterectomy, Care After This sheet gives you information about how to care for yourself after your procedure. Your health care provider may also give you more specific instructions. If you have problems or questions, contact your health care provider. What can I expect after the procedure? After the procedure, it is common to have:  Soreness and numbness in your incision areas.  Abdominal pain. You will be given pain medicine to control it.  Vaginal bleeding and discharge. You will need to use a sanitary napkin after this procedure.  Sore throat from the breathing tube that was inserted during surgery. Follow these instructions at home: Medicines  Take over-the-counter and prescription medicines only as told by your health care provider.  Do not take aspirin or ibuprofen. These medicines can cause bleeding.  Do not drive or use heavy machinery while taking prescription pain medicine.  Do not drive for 24 hours if you were given a medicine to help you relax (sedative) during the procedure. Incision care   Follow instructions from your health care provider about how to take care of your incisions. Make sure you: ? Wash your hands with soap and water before you change your bandage (dressing). If soap and water are not available, use hand sanitizer. ? Change your dressing as told by your health care provider. ? Leave stitches (sutures), skin glue, or adhesive strips in place. These skin closures may need to stay in place for 2 weeks or longer. If adhesive strip edges start to loosen and curl up, you may trim the loose edges. Do not remove adhesive strips completely unless your health care provider tells you to do that.  Check your incision area every day for signs of infection. Check for: ? Redness, swelling, or pain. ? Fluid or blood. ? Warmth. ? Pus or a bad smell. Activity  Get regular exercise as told by your health care provider. You may be  told to take short walks every day and go farther each time.  Return to your normal activities as told by your health care provider. Ask your health care provider what activities are safe for you.  Do not douche, use tampons, or have sexual intercourse for at least 6 weeks, or until your health care provider gives you permission.  Do not lift anything that is heavier than 10 lb (4.5 kg), or the limit that your health care provider tells you, until he or she says that it is safe. General instructions  Do not take baths, swim, or use a hot tub until your health care provider approves. Take showers instead of baths.  Do not drive for 24 hours if you received a sedative.  Do not drive or operate heavy machinery while taking prescription pain medicine.  To prevent or treat constipation while you are taking prescription pain medicine, your health care provider may recommend that you: ? Drink enough fluid to keep your urine clear or pale yellow. ? Take over-the-counter or prescription medicines. ? Eat foods that are high in fiber, such as fresh fruits and vegetables, whole grains, and beans. ? Limit foods that are high in fat and processed sugars, such as fried and sweet foods.  Keep all follow-up visits as told by your health care provider. This is important. Contact a health care provider if:  You have signs of infection, such as: ? Redness, swelling, or pain around your incision sites. ? Fluid or blood coming from an incision. ? An incision that feels warm to the   touch. ? Pus or a bad smell coming from an incision.  Your incision breaks open.  Your pain medicine is not helping.  You feel dizzy or light-headed.  You have pain or bleeding when you urinate.  You have persistent nausea and vomiting.  You have blood, pus, or a bad-smelling discharge from your vagina. Get help right away if:  You have a fever.  You have severe abdominal pain.  You have chest pain.  You have  shortness of breath.  You faint.  You have pain, swelling, or redness in your leg.  You have heavy bleeding from your vagina. Summary  After the procedure, it is common to have abdominal pain and vaginal bleeding.  You should not drive or lift heavy objects until your health care provider says that it is safe.  Contact your health care provider if you have any symptoms of infection, excessive vaginal bleeding, nausea, vomiting, or shortness of breath. This information is not intended to replace advice given to you by your health care provider. Make sure you discuss any questions you have with your health care provider. Document Revised: 01/10/2017 Document Reviewed: 03/26/2016 Elsevier Patient Education  2020 [employer]vier [employer]  

## 2020-02-01 NOTE — Progress Notes (Signed)
Dr. Corinna Capra called updated on pt status, orders received

## 2020-02-01 NOTE — Op Note (Signed)
NAME: Jaime Fletcher, TAPPER MEDICAL RECORD [id number] ACCOUNT [id number] DATE OF BIRTH:1970/12/26 FACILITY: [facility] LOCATION: [facility] PHYSICIAN:Nathanal Hermiz C. Massiel Stipp, MD  OPERATIVE REPORT  DATE OF PROCEDURE:  01/31/2020  PREOPERATIVE DIAGNOSES:  A 12-week size uterine fibroids, abnormal uterine bleeding, pelvic pain, malposition of IUD and dyspareunia.  POSTOPERATIVE DIAGNOSES:  A 12-week size uterine fibroids, abnormal uterine bleeding, pelvic pain, malposition of IUD and dyspareunia.  PROCEDURE:  Laparoscopic-assisted vaginal hysterectomy with bilateral salpingectomy.  SURGEON:  Candice Camp, MD  ASSISTANT:  Dr. Mitchel Honour.  ANESTHESIA:  General endotracheal.  INDICATIONS:  The patient is a 49 year old who presented to me with large fibroids, pelvic pain, dyspareunia, abnormal bleeding and malposition of the IUD.  Recent FSH showed that she is not menopausal.  She desires definitive surgical intervention,  requests  hysterectomy.  Planned to proceed with laparoscopic-assisted vaginal hysterectomy with preservation of her ovaries.  We discussed the risks and benefits and pros and cons and recovery at length.  All of her questions were answered.  She gives  informed consent.  FINDINGS:  At the time of surgery were grossly enlarged uterus, approximately 12-week size.  Normal appearing ovaries.   Normal-appearing liver and gallbladder.  The appendix was surgically absent.  There were some adhesions from the omentum to the  anterior abdominal wall where the previous appendix had been.  DESCRIPTION OF PROCEDURE:  After adequate analgesia, the patient was placed in the dorsal lithotomy position.  She was sterilely prepped and draped.  The bladder was sterilely drained.  Graves speculum was placed.  A Cohen tenaculum was placed on the  cervix.  A 1 cm infraumbilical skin incision was then made.  A Veress needle was inserted.  The abdomen was insufflated to dullness to percussion.  An 11 mm trocar was  inserted.  The laparoscope was then inserted.  The above findings were noted.  A 5 mm  trocar was inserted on the left of the midline under direct visualization.  After careful and systematic evaluation of the abdomen and pelvis, the right and left fallopian tubes were individually grasped with Gyrus cutting forceps used to ligate across  the mesosalpinx, down across the uteroovarian ligament, down across the round ligament to the inferior portion of the broad ligament.  This was done bilaterally with good hemostasis achieved.  The bladder was then elevated and the bladder flap was  created using the Gyrus cutting forceps.  The abdomen was then desufflated.  Legs were repositioned.  Weighted speculum placed.  Posterior colpotomy was performed.  The cervix was then circumscribed with Bovie cautery.  An Impact LigaSure instrument was  used to ligate across the uterosacral ligaments bilaterally, bladder and pelvis bilaterally up to the uterine vessels bilaterally, which were ligated after cauterization.  The anterior vaginal mucosa was then dissected off the cervix and the anterior  peritoneum was entered.  Deaver retractor placed underneath the bladder.  The LigaSure instrument was used to ligate across the inferior portion of the broad ligament bilaterally until all the pedicles had been removed.  The cervix was grasped and unable  to deliver _____ the uterus due to its large size.  We did core the central section of uterus out.  Large fibroid noted inside and also IUD was easily removed, after coring the uterus, was easily removed.  Small packing was placed and the uterosacral  ligaments were identified, suture ligated with a figure-of-eight of 0 Monocryl suture bilaterally.  Posterior peritoneum was then closed with pursestring fashion with 0 Monocryl suture.  The posterior vaginal mucosa was closed with figure-of-eights of 0  Monocryl suture, plicating the uterosacral ligaments in the midline.  The packing  was then removed and the anterior vaginal mucosa was then closed in a vertical fashion using figure-of-eights of 0 Monocryl suture with good approximation, good hemostasis  and good support of the vagina noted.  Foley catheter was placed with return of clear yellow urine.  The legs were repositioned.  The abdomen was insufflated with dullness to percussion.  The laparoscope was used to inspect the abdomen and pelvis.  Good  hemostasis was noted; however, there were small areas of peritoneal bleeding, which were cauterized with bipolar cautery and then Arista was placed with good hemostasis achieved.  The abdomen was then desufflated.  Trocars were removed.  Infraumbilical  skin incision was closed with 0 Vicryl interrupted suture in the fascia, 3-0 Vicryl Rapide subcuticular suture.  The 5 mm site was closed with 3-0 Vicryl Rapide subcuticular suture.  Incisions were injected with 0.25% Marcaine, total 10 mL used.  The  patient was stable, transferred to recovery room.  Sponge and instrument count was normal x3.  Estimated blood loss was 100 mL.  The patient will stay overnight in observation.  She did receive 2 grams of cefotetan preoperatively.  HN/NUANCE  D:01/31/2020 T:02/01/2020 JOB:[id number]

## 2020-02-22 ENCOUNTER — Other Ambulatory Visit: Payer: Self-pay

## 2020-02-22 ENCOUNTER — Encounter: Payer: Self-pay | Admitting: Family Medicine

## 2020-02-22 DIAGNOSIS — Z90710 Acquired absence of both cervix and uterus: Secondary | ICD-10-CM

## 2020-02-22 NOTE — Progress Notes (Unsigned)
Ref placed to Jaime Fletcher

## 2020-03-08 ENCOUNTER — Encounter: Payer: Self-pay | Admitting: Family Medicine

## 2020-03-09 ENCOUNTER — Ambulatory Visit: Payer: 59 | Admitting: Family Medicine

## 2020-03-13 ENCOUNTER — Encounter: Payer: Self-pay | Admitting: Family Medicine

## 2020-03-13 ENCOUNTER — Other Ambulatory Visit: Payer: Self-pay

## 2020-03-13 ENCOUNTER — Ambulatory Visit (INDEPENDENT_AMBULATORY_CARE_PROVIDER_SITE_OTHER): Payer: No Typology Code available for payment source | Admitting: Family Medicine

## 2020-03-13 VITALS — BP 120/62 | HR 88 | Ht 61.0 in | Wt 142.0 lb

## 2020-03-13 DIAGNOSIS — R5383 Other fatigue: Secondary | ICD-10-CM

## 2020-03-13 DIAGNOSIS — Z862 Personal history of diseases of the blood and blood-forming organs and certain disorders involving the immune mechanism: Secondary | ICD-10-CM | POA: Diagnosis not present

## 2020-03-13 DIAGNOSIS — D3130 Benign neoplasm of unspecified choroid: Secondary | ICD-10-CM

## 2020-03-13 DIAGNOSIS — R635 Abnormal weight gain: Secondary | ICD-10-CM
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
## 2020-03-14 ENCOUNTER — Other Ambulatory Visit: Payer: Self-pay

## 2020-03-14 ENCOUNTER — Encounter: Payer: Self-pay | Admitting: Family Medicine

## 2020-03-14 DIAGNOSIS — R5383 Other fatigue: Secondary | ICD-10-CM

## 2020-03-14 DIAGNOSIS — G4700 Insomnia, unspecified: Secondary | ICD-10-CM

## 2020-03-14 LAB — CBC WITH DIFFERENTIAL/PLATELET
Basophils Absolute: 0 10*3/uL (ref 0.0–0.2)
Basos: 0 %
EOS (ABSOLUTE): 0.1 10*3/uL (ref 0.0–0.4)
Eos: 1 %
Hematocrit: 40.3 % (ref 34.0–46.6)
Hemoglobin: 13.5 g/dL (ref 11.1–15.9)
Immature Grans (Abs): 0 10*3/uL (ref 0.0–0.1)
Immature Granulocytes: 0 %
Lymphocytes Absolute: 2.5 10*3/uL (ref 0.7–3.1)
Lymphs: 35 %
MCH: 32.5 pg (ref 26.6–33.0)
MCHC: 33.5 g/dL (ref 31.5–35.7)
MCV: 97 fL (ref 79–97)
Monocytes Absolute: 0.7 10*3/uL (ref 0.1–0.9)
Monocytes: 9 %
Neutrophils Absolute: 3.9 10*3/uL (ref 1.4–7.0)
Neutrophils: 55 %
Platelets: 308 10*3/uL (ref 150–450)
RBC: 4.16 x10E6/uL (ref 3.77–5.28)
RDW: 11.9 % (ref 11.7–15.4)
WBC: 7.2 10*3/uL (ref 3.4–10.8)

## 2020-03-14 LAB — HEPATIC FUNCTION PANEL (6)
ALT: 21 IU/L (ref 0–32)
AST: 17 IU/L (ref 0–40)
Albumin: 4.6 g/dL (ref 3.8–4.8)
Alkaline Phosphatase: 71 IU/L (ref 44–121)
Bilirubin Total: 0.4 mg/dL (ref 0.0–1.2)
Bilirubin, Direct: 0.11 mg/dL (ref 0.00–0.40)

## 2020-03-14 LAB — SEDIMENTATION RATE: Sed Rate: 9 mm/hr (ref 0–32)

## 2020-03-14 LAB — THYROID PANEL WITH TSH
Free Thyroxine Index: 1.8 (ref 1.2–4.9)
T3 Uptake Ratio: 23 % — ABNORMAL LOW (ref 24–39)
T4, Total: 7.8 ug/dL (ref 4.5–12.0)
TSH: 3.09 u[IU]/mL (ref 0.450–4.500)

## 2020-03-14 NOTE — Progress Notes (Signed)
Placed ref to neuro

## 2020-04-03 ENCOUNTER — Other Ambulatory Visit: Payer: Self-pay

## 2020-04-03 ENCOUNTER — Encounter: Payer: Self-pay | Admitting: Family Medicine

## 2020-04-03 ENCOUNTER — Ambulatory Visit (INDEPENDENT_AMBULATORY_CARE_PROVIDER_SITE_OTHER): Payer: No Typology Code available for payment source | Admitting: Family Medicine

## 2020-04-03 VITALS — BP 120/64 | HR 80 | Temp 98.4°F | Ht 61.0 in | Wt 140.0 lb

## 2020-04-03 DIAGNOSIS — J0100 Acute maxillary sinusitis, unspecified: Secondary | ICD-10-CM | POA: Diagnosis not present

## 2020-04-03 MED ORDER — AMOXICILLIN-POT CLAVULANATE 875-125 MG PO TABS: 1.0000 | ORAL_TABLET | Freq: Two times a day (BID) | ORAL | 0 refills | Status: DC

## 2020-04-03 NOTE — Progress Notes (Signed)
Date:  04/03/2020   Name:  Jaime Fletcher   DOB:  1970-05-13   MRN:  [id number]   Chief Complaint: Sore Throat (Started Friday after dinner/ Saturday have sore throat and fever. Neck hurting as well)  Sore Throat  This is a new problem. The current episode started in the past 7 days (friday). The problem has been waxing and waning. Neither side of throat is experiencing more pain than the other. The maximum temperature recorded prior to her arrival was 101 - 101.9 F. The pain is at a severity of 7/10. The pain is moderate. Associated symptoms include headaches. Pertinent negatives include no abdominal pain, congestion, coughing, diarrhea, drooling, ear discharge, ear pain, hoarse voice, plugged ear sensation, neck pain, shortness of breath, stridor, swollen glands, trouble swallowing or vomiting. Associated symptoms comments: Facial pain. She has had no exposure to strep or mono.    Lab Results  Component Value Date   CREATININE 0.58 01/24/2020   BUN 21 (H) 01/24/2020   NA 139 01/24/2020   K 3.9 01/24/2020   CL 107 01/24/2020   CO2 24 01/24/2020   No results found for: CHOL, HDL, LDLCALC, LDLDIRECT, TRIG, CHOLHDL Lab Results  Component Value Date   TSH 3.090 03/13/2020   Lab Results  Component Value Date   HGBA1C 4.9 08/05/2017   Lab Results  Component Value Date   WBC 7.2 03/13/2020   HGB 13.5 03/13/2020   HCT 40.3 03/13/2020   MCV 97 03/13/2020   PLT 308 03/13/2020   Lab Results  Component Value Date   ALT 21 03/13/2020   AST 17 03/13/2020   ALKPHOS 71 03/13/2020   BILITOT 0.4 03/13/2020     Review of Systems  Constitutional: Negative.  Negative for chills, fatigue, fever and unexpected weight change.  HENT: Negative for congestion, drooling, ear discharge, ear pain, hoarse voice, rhinorrhea, sinus pressure, sneezing, sore throat and trouble swallowing.        Facial/maxillary pain  Eyes: Negative for double vision, photophobia, pain, discharge, redness and  itching.  Respiratory: Negative for cough, shortness of breath, wheezing and stridor.   Cardiovascular: Negative for chest pain, palpitations and leg swelling.  Gastrointestinal: Negative for abdominal pain, blood in stool, constipation, diarrhea, nausea and vomiting.  Endocrine: Negative for cold intolerance, heat intolerance, polydipsia, polyphagia and polyuria.  Genitourinary: Negative for dysuria, flank pain, frequency, hematuria, menstrual problem, pelvic pain, urgency, vaginal bleeding and vaginal discharge.  Musculoskeletal: Negative for arthralgias, back pain, myalgias and neck pain.  Skin: Negative for rash.  Allergic/Immunologic: Negative for environmental allergies and food allergies.  Neurological: Positive for headaches. Negative for dizziness, weakness, light-headedness and numbness.  Hematological: Negative for adenopathy. Does not bruise/bleed easily.  Psychiatric/Behavioral: Negative for dysphoric mood. The patient is not nervous/anxious.     Patient Active Problem List   Diagnosis Date Noted  . S/P laparoscopic assisted vaginal hysterectomy (LAVH) 01/31/2020  . Decreased libido 07/03/2018  . Cervical stenosis (uterine cervix) 07/03/2018  . IUD threads lost 07/03/2018  . Breakthrough bleeding associated with intrauterine device (IUD) 07/03/2018  . Left ovarian cyst 07/03/2018  . Submucous leiomyoma of uterus 12/17/2017  . Paroxysmal tachycardia ([place]) 09/09/2017  . Chest pain 09/09/2017  . Insomnia 09/09/2017  . Depression 08/05/2017  . Abnormal Pap smear of cervix 11/20/2016  . Anxiety 11/20/2016  . Fibromyalgia 11/20/2016  . Encounter for insertion of intrauterine contraceptive device (IUD) 11/20/2016  . B12 deficiency 06/19/2015  . Vitamin D deficiency, unspecified 06/19/2015  .  Vitamin D deficiency 06/19/2015  . Myopic astigmatism of both eyes 08/10/2014  . Palpitations 05/19/2013  . Attention deficit disorder 08/17/2010  . Dysthymic disorder 08/17/2010  .  Obsessive-compulsive disorder 08/17/2010  . Posttraumatic stress disorder 08/17/2010    Allergies  Allergen Reactions  . Hydrocodone-Acetaminophen Nausea And Vomiting  . Prednisone Other (See Comments)    Insomnia, violence  . Amitriptyline Other (See Comments)    Tremors     Past Surgical History:  Procedure Laterality Date  . ABDOMINAL HYSTERECTOMY  01/31/2020  . APPENDECTOMY    . AUGMENTATION MAMMAPLASTY Bilateral 2005  . cold knife bx    . DILATION AND CURETTAGE OF UTERUS    . LAPAROSCOPIC VAGINAL HYSTERECTOMY WITH SALPINGECTOMY Bilateral 01/31/2020   Procedure: LAPAROSCOPIC ASSISTED VAGINAL HYSTERECTOMY WITH BILATERAL SALPINGECTOMY IUD REMOVAL;  Surgeon: Louretta Shorten, MD;  Location: [facility];  Service: Gynecology;  Laterality: Bilateral;  . SPINE SURGERY    . TONSILLECTOMY      Social History   Tobacco Use  . Smoking status: Never Smoker  . Smokeless tobacco: Never Used  Vaping Use  . Vaping Use: Never used  Substance Use Topics  . Alcohol use: Yes    Comment: social  . Drug use: No     Medication list has been reviewed and updated.  Current Meds  Medication Sig  . cyanocobalamin (,VITAMIN B-12,) 1000 MCG/ML injection Inject 1 mL (1,000 mcg total) into the muscle every 30 (thirty) days.  . diphenhydramine-acetaminophen (TYLENOL PM) 25-500 MG TABS tablet Take 1 tablet by mouth at bedtime as needed.  . fluticasone (FLONASE) 50 MCG/ACT nasal spray INSTILL 2 SPRAYS INTO EACH NOSTRIL DAILYAT BEDTIME (Patient taking differently: Place 2 sprays into both nostrils daily as needed (allergies.).)  . hydrOXYzine (ATARAX/VISTARIL) 50 MG tablet Take 25-50 mg by mouth 2 (two) times daily as needed for anxiety.  Marland Kitchen ibuprofen (ADVIL) 600 MG tablet Take 1 tablet (600 mg total) by mouth every 6 (six) hours as needed for mild pain or moderate pain.  . Ibuprofen-diphenhydrAMINE HCl (ADVIL PM) 200-25 MG CAPS Take 1 tablet by mouth at bedtime as needed (sleep).  .  L-THEANINE PO Take 0.5 drops by mouth at bedtime as needed (rest/anxiousness). 0.5 dropper  . MAGNESIUM PO Take 325 mg by mouth every evening. Powder  . pantoprazole (PROTONIX) 40 MG tablet Take 1 tablet (40 mg total) by mouth daily. (Patient taking differently: Take 40 mg by mouth daily before breakfast.)  . Probiotic Product (PROBIOTIC PEARLS) CAPS Take by mouth.  . propranolol (INDERAL) 10 MG tablet Take 10 mg by mouth daily as needed for anxiety.  . traZODone (DESYREL) 50 MG tablet Take 50 mg by mouth at bedtime as needed for sleep.  . Vitamin D, Ergocalciferol, (DRISDOL) 1.25 MG (50000 UNIT) CAPS capsule Take 50,000 Units by mouth every Wednesday.    PHQ 2/9 Scores 11/23/2019 09/30/2018 08/05/2017  PHQ - 2 Score 0 0 0  PHQ- 9 Score 0 3 4    GAD 7 : Generalized Anxiety Score 11/23/2019  Nervous, Anxious, on Edge 1  Control/stop worrying 0  Worry too much - different things 0  Trouble relaxing 1  Restless 1  Easily annoyed or irritable 0  Afraid - awful might happen 1  Total GAD 7 Score 4  Anxiety Difficulty Not difficult at all    BP Readings from Last 3 Encounters:  04/03/20 120/64  03/13/20 120/62  02/01/20 (!) 95/46    Physical Exam Vitals and nursing note reviewed.  Constitutional:      Appearance: She is well-developed and well-nourished.  HENT:     Head: Normocephalic.     Jaw: There is normal jaw occlusion.     Right Ear: External ear normal. Tympanic membrane is retracted.     Left Ear: Tympanic membrane and external ear normal.     Nose: Congestion present.     Right Turbinates: Enlarged.     Left Turbinates: Enlarged.     Mouth/Throat:     Lips: Pink.     Mouth: Mucous membranes are moist.     Pharynx: Pharyngeal swelling and posterior oropharyngeal erythema present.     Comments: Yellow postnasal /right maxillary Eyes:     General: Lids are everted, no foreign bodies appreciated. No scleral icterus.       Left eye: No foreign body or hordeolum.      Extraocular Movements: EOM normal.     Conjunctiva/sclera: Conjunctivae normal.     Right eye: Right conjunctiva is not injected.     Left eye: Left conjunctiva is not injected.     Pupils: Pupils are equal, round, and reactive to light.  Neck:     Thyroid: No thyromegaly.     Vascular: No JVD.     Trachea: No tracheal deviation.  Cardiovascular:     Rate and Rhythm: Normal rate and regular rhythm.     Pulses: Intact distal pulses.     Heart sounds: Normal heart sounds. No murmur heard. No friction rub. No gallop.   Pulmonary:     Effort: Pulmonary effort is normal. No respiratory distress.     Breath sounds: Normal breath sounds. No decreased breath sounds, wheezing, rhonchi or rales.  Abdominal:     General: Bowel sounds are normal.     Palpations: Abdomen is soft. There is no hepatosplenomegaly or mass.     Tenderness: There is no abdominal tenderness. There is no guarding or rebound.  Musculoskeletal:        General: No tenderness or edema. Normal range of motion.     Cervical back: Normal range of motion and neck supple.  Lymphadenopathy:     Cervical: No cervical adenopathy.  Skin:    General: Skin is warm.     Findings: No rash.  Neurological:     Mental Status: She is alert and oriented to person, place, and time.     Cranial Nerves: No cranial nerve deficit.     Deep Tendon Reflexes: Strength normal. Reflexes normal.  Psychiatric:        Mood and Affect: Mood and affect normal. Mood is not anxious or depressed.     Wt Readings from Last 3 Encounters:  04/03/20 140 lb (63.5 kg)  03/13/20 142 lb (64.4 kg)  01/31/20 138 lb 9.6 oz (62.9 kg)    BP 120/64   Pulse 80   Temp 98.4 F (36.9 C) (Oral)   Ht 5\' 1"  (1.549 m)   Wt 140 lb (63.5 kg)   LMP 01/20/2020   BMI 26.45 kg/m   Assessment and Plan: 1. Acute maxillary sinusitis, recurrence not specified New onset.  Persistent.  Stable.  On examination patient has erythematous throat but there is a bit of  purulence that is postnasal draining on particular the right side to suggest an infection of the right maxillary this is confirmed with tenderness over the right maxillary and we will initiate Augmentin 875 mg twice a day for 10 days. - amoxicillin-clavulanate (AUGMENTIN) 875-125 MG tablet; Take  1 tablet by mouth 2 (two) times daily.  Dispense: 20 tablet; Refill: 0

## 2020-04-05 ENCOUNTER — Encounter: Payer: Self-pay | Admitting: Family Medicine

## 2020-10-09 ENCOUNTER — Other Ambulatory Visit: Payer: Self-pay | Admitting: Family Medicine

## 2020-10-09 DIAGNOSIS — K219 Gastro-esophageal reflux disease without esophagitis: Secondary | ICD-10-CM

## 2020-10-09 NOTE — Telephone Encounter (Signed)
Requested medications are due for refill today.  yes  Requested medications are on the active medications list.  yes  Last refill. 11/23/2019  Future visit scheduled.   no  Notes to clinic.  PCP is listed as Thereasa Distance. Pt saw him in May.

## 2020-11-30 ENCOUNTER — Other Ambulatory Visit: Payer: Self-pay | Admitting: Family Medicine

## 2020-12-01 ENCOUNTER — Other Ambulatory Visit: Payer: Self-pay | Admitting: Family Medicine

## 2020-12-01 DIAGNOSIS — K219 Gastro-esophageal reflux disease without esophagitis: Secondary | ICD-10-CM

## 2020-12-05 ENCOUNTER — Other Ambulatory Visit: Payer: Self-pay | Admitting: Gerontology

## 2020-12-05 DIAGNOSIS — N644 Mastodynia: Secondary | ICD-10-CM

## 2021-02-13 ENCOUNTER — Other Ambulatory Visit: Payer: Self-pay

## 2021-02-20 ENCOUNTER — Other Ambulatory Visit: Payer: Self-pay

## 2021-02-20 ENCOUNTER — Ambulatory Visit
Admission: RE | Admit: 2021-02-20 | Discharge: 2021-02-20 | Disposition: A | Discharge status: Home / Self Care | Payer: No Typology Code available for payment source | Source: Ambulatory Visit | Attending: Gerontology | Admitting: Gerontology

## 2021-02-20 DIAGNOSIS — N644 Mastodynia: Secondary | ICD-10-CM | POA: Insufficient documentation

## 2021-04-02 ENCOUNTER — Telehealth: Payer: Self-pay

## 2021-04-02 NOTE — Telephone Encounter (Signed)
CALLED PATIENT NO ANSWER LEFT VOICEMAIL FOR A CALL BACK °Letter sent °

## 2021-04-12 ENCOUNTER — Encounter: Payer: Self-pay | Admitting: Cardiovascular Disease

## 2021-05-08 ENCOUNTER — Telehealth: Payer: Self-pay

## 2021-05-08 NOTE — Telephone Encounter (Signed)
Left a message for this pt to establish care. She called the after hours line to be a new pt here  ?

## 2021-08-01 ENCOUNTER — Other Ambulatory Visit
Admission: RE | Admit: 2021-08-01 | Discharge: 2021-08-01 | Disposition: A | Discharge status: Home / Self Care | Payer: No Typology Code available for payment source | Attending: Family Medicine | Admitting: Family Medicine

## 2021-08-01 DIAGNOSIS — E559 Vitamin D deficiency, unspecified: Secondary | ICD-10-CM | POA: Insufficient documentation

## 2021-08-01 DIAGNOSIS — E538 Deficiency of other specified B group vitamins: Secondary | ICD-10-CM | POA: Diagnosis present

## 2021-08-01 DIAGNOSIS — Z833 Family history of diabetes mellitus: Secondary | ICD-10-CM | POA: Insufficient documentation

## 2021-08-01 LAB — CBC WITH DIFFERENTIAL/PLATELET
Abs Immature Granulocytes: 0.03 10*3/uL (ref 0.00–0.07)
Basophils Absolute: 0 10*3/uL (ref 0.0–0.1)
Basophils Relative: 0 %
Eosinophils Absolute: 0.1 10*3/uL (ref 0.0–0.5)
Eosinophils Relative: 2 %
HCT: 41.5 % (ref 36.0–46.0)
Hemoglobin: 13.9 g/dL (ref 12.0–15.0)
Immature Granulocytes: 0 %
Lymphocytes Relative: 34 %
Lymphs Abs: 2.3 10*3/uL (ref 0.7–4.0)
MCH: 33 pg (ref 26.0–34.0)
MCHC: 33.5 g/dL (ref 30.0–36.0)
MCV: 98.6 fL (ref 80.0–100.0)
Monocytes Absolute: 0.7 10*3/uL (ref 0.1–1.0)
Monocytes Relative: 11 %
Neutro Abs: 3.5 10*3/uL (ref 1.7–7.7)
Neutrophils Relative %: 53 %
Platelets: 276 10*3/uL (ref 150–400)
RBC: 4.21 MIL/uL (ref 3.87–5.11)
RDW: 12.7 % (ref 11.5–15.5)
WBC: 6.7 10*3/uL (ref 4.0–10.5)
nRBC: 0 % (ref 0.0–0.2)

## 2021-08-01 LAB — LIPID PANEL
Cholesterol: 191 mg/dL (ref 0–200)
HDL: 47 mg/dL (ref >40)
LDL Cholesterol: 117 mg/dL — ABNORMAL HIGH (ref 0–99)
Total CHOL/HDL Ratio: 4.1 RATIO
Triglycerides: 133 mg/dL (ref <150)
VLDL: 27 mg/dL (ref 0–40)

## 2021-08-01 LAB — COMPREHENSIVE METABOLIC PANEL
ALT: 23 U/L (ref 0–44)
AST: 19 U/L (ref 15–41)
Albumin: 4.2 g/dL (ref 3.5–5.0)
Alkaline Phosphatase: 55 U/L (ref 38–126)
Anion gap: 8 (ref 5–15)
BUN: 15 mg/dL (ref 6–20)
CO2: 26 mmol/L (ref 22–32)
Calcium: 9.3 mg/dL (ref 8.9–10.3)
Chloride: 104 mmol/L (ref 98–111)
Creatinine, Ser: 0.64 mg/dL (ref 0.44–1.00)
GFR, Estimated: >60 mL/min (ref >60)
Glucose, Bld: 96 mg/dL (ref 70–99)
Potassium: 4.1 mmol/L (ref 3.5–5.1)
Sodium: 138 mmol/L (ref 135–145)
Total Bilirubin: 1.2 mg/dL (ref 0.3–1.2)
Total Protein: 7.8 g/dL (ref 6.5–8.1)

## 2021-08-01 LAB — HEMOGLOBIN A1C
Hgb A1c MFr Bld: 5 % (ref 4.8–5.6)
Mean Plasma Glucose: 96.8 mg/dL

## 2021-08-01 LAB — VITAMIN D 25 HYDROXY (VIT D DEFICIENCY, FRACTURES): Vit D, 25-Hydroxy: 29.34 ng/mL — ABNORMAL LOW (ref 30–100)

## 2021-08-01 LAB — VITAMIN B12: Vitamin B-12: 716 pg/mL (ref 180–914)

## 2021-08-21 ENCOUNTER — Ambulatory Visit: Payer: No Typology Code available for payment source

## 2021-08-28 ENCOUNTER — Other Ambulatory Visit: Payer: Self-pay

## 2021-08-28 MED ORDER — PROGESTERONE MICRONIZED 100 MG PO CAPS
100.0000 mg | ORAL_CAPSULE | Freq: Every evening | ORAL | 6 refills | Status: DC
  Filled 2021-08-28: qty 30, 30d supply, fill #0
  Filled 2021-09-13 – 2021-10-09 (×2): qty 30, 30d supply, fill #1
  Filled 2021-11-01: qty 30, 30d supply, fill #2
  Filled 2021-12-03: qty 26, 26d supply, fill #3
  Filled 2021-12-03: qty 4, 4d supply, fill #3
  Filled 2021-12-19: qty 30, 30d supply, fill #4
  Filled 2022-01-30: qty 30, 30d supply, fill #5

## 2021-08-28 MED ORDER — ESTRADIOL 0.1 MG/24HR TD PTTW
MEDICATED_PATCH | TRANSDERMAL | 6 refills | Status: DC
  Filled 2021-08-28: qty 8, 28d supply, fill #0
  Filled 2021-09-13: qty 8, 28d supply, fill #1
  Filled 2021-10-16: qty 8, 28d supply, fill #0
  Filled 2021-10-16: qty 8, 28d supply, fill #2
  Filled 2021-11-01: qty 8, 28d supply, fill #1
  Filled 2021-12-03: qty 8, 28d supply, fill #2
  Filled 2021-12-19: qty 8, 28d supply, fill #3

## 2021-08-29 ENCOUNTER — Other Ambulatory Visit: Payer: Self-pay

## 2021-09-06 ENCOUNTER — Encounter: Payer: Self-pay | Admitting: Physical Therapy

## 2021-09-06 ENCOUNTER — Ambulatory Visit
Payer: No Typology Code available for payment source | Attending: Obstetrics and Gynecology | Admitting: Physical Therapy

## 2021-09-06 DIAGNOSIS — R2689 Other abnormalities of gait and mobility: Secondary | ICD-10-CM | POA: Insufficient documentation

## 2021-09-06 DIAGNOSIS — R278 Other lack of coordination: Secondary | ICD-10-CM | POA: Insufficient documentation

## 2021-09-06 DIAGNOSIS — M533 Sacrococcygeal disorders, not elsewhere classified: Secondary | ICD-10-CM | POA: Insufficient documentation

## 2021-09-06 NOTE — Therapy (Addendum)
OUTPATIENT PHYSICAL THERAPY EVALUATION   Patient Name: Jaime Fletcher MRN: [id number] DOB:05-06-1970, 51 y.o., female Today's Date: 09/06/2021   PT End of Session - 09/06/21 1636     Visit Number 1    Number of Visits 10    Date for PT Re-Evaluation 11/15/21    PT Start Time 1632    PT Stop Time 4098    PT Time Calculation (min) 43 min    Activity Tolerance Patient tolerated treatment well    Behavior During Therapy WFL for tasks assessed/performed             Past Medical History:  Diagnosis Date   Abnormal Pap smear of cervix    dysplasia   ADD (attention deficit disorder)    B12 deficiency    Fibroid    GERD (gastroesophageal reflux disease)    Migraine headache    Pneumonia    PTSD (post-traumatic stress disorder)    PTSD (post-traumatic stress disorder)    Past Surgical History:  Procedure Laterality Date   ABDOMINAL HYSTERECTOMY  01/31/2020   APPENDECTOMY     AUGMENTATION MAMMAPLASTY Bilateral 2005   cold knife bx     DILATION AND CURETTAGE OF UTERUS     LAPAROSCOPIC VAGINAL HYSTERECTOMY WITH SALPINGECTOMY Bilateral 01/31/2020   Procedure: LAPAROSCOPIC ASSISTED VAGINAL HYSTERECTOMY WITH BILATERAL SALPINGECTOMY IUD REMOVAL;  Surgeon: Louretta Shorten, MD;  Location: [place];  Service: Gynecology;  Laterality: Bilateral;   SPINE SURGERY     C3-4 fusion   TONSILLECTOMY     Patient Active Problem List   Diagnosis Date Noted   S/P laparoscopic assisted vaginal hysterectomy (LAVH) 01/31/2020   Decreased libido 07/03/2018   Cervical stenosis (uterine cervix) 07/03/2018   IUD threads lost 07/03/2018   Breakthrough bleeding associated with intrauterine device (IUD) 07/03/2018   Left ovarian cyst 07/03/2018   Submucous leiomyoma of uterus 12/17/2017   Paroxysmal tachycardia ([place]) 09/09/2017   Chest pain 09/09/2017   Insomnia 09/09/2017   Depression 08/05/2017   Abnormal Pap smear of cervix 11/20/2016   Anxiety 11/20/2016   Fibromyalgia  11/20/2016   Encounter for insertion of intrauterine contraceptive device (IUD) 11/20/2016   B12 deficiency 06/19/2015   Vitamin D deficiency, unspecified 06/19/2015   Vitamin D deficiency 06/19/2015   Myopic astigmatism of both eyes 08/10/2014   Palpitations 05/19/2013   Attention deficit disorder 08/17/2010   Dysthymic disorder 08/17/2010   Obsessive-compulsive disorder 08/17/2010   Posttraumatic stress disorder 08/17/2010    PCP: Ellison Hughs MD   REFERRING PROVIDER: Louretta Shorten MD   REFERRING DIAG: N39.3 (ICD-10-CM) - Stress incontinence (female) (female)  Rationale for Evaluation and Treatment Rehabilitation  THERAPY DIAG:  Other lack of coordination  Other abnormalities of gait and mobility  Sacrococcygeal disorders, not elsewhere classified  ONSET DATE:  SUI started 2 years ago when she had fibroid the soft ball size which prompted the hysterectomy   SUBJECTIVE:  SUBJECTIVE STATEMENT: 1) SUI occurs far and between. When she drinks green tea in the morning, she notices her leakage is more prominent.  Leakage also occurs when she has a deep laugh and running. Pt wear a panty liner daily.  Pt want to prevent further issues.   Hx of fall onto her tailbone after falling ice in 2000. Vaginal deliveries for twins and one more child with perineal tears.  Denied LBP, constipation. Pt would like to get back to the gym with light weight training. 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 Pt does not do sit-up/ crunches.   PERTINENT HISTORY:  she had fibroid the soft ball size which prompted the hysterectomy.  Hx of appendectomy. C3-4 fusion.   PAIN:  Are you having pain? no   PRECAUTIONS: None  WEIGHT BEARING RESTRICTIONS No  FALLS:  Has patient fallen in last 6 months? No  LIVING  ENVIRONMENT: Lives with: lives with their family Lives in: House/apartment Stairs: Yes: External: 3 steps; on right going up   OCCUPATION: administrative services   PLOF: Independent  PATIENT GOALS   Get a head of problems, eliminate leakage altogether, return to gym    OBJECTIVE:    [facility] PT Assessment - 09/06/21 1717       Observation/Other Assessments   Observations R shoulder slightly elevated,      Posture/Postural Control   Posture Comments crossed ankles, thighs      Strength   Overall Strength Comments hip flex/knee flex/ ext B 5/5,  B hip abd 3+/5      Palpation   Spinal mobility levelled pelvic girdle    SI assessment  coccyx deviated to L, no pain    Palpation comment appendeectomy scar deep and restricted 9 cm long      Bed Mobility   Bed Mobility --   lifts head to roll              [facility] Adult PT Treatment/Exercise - 09/06/21 1717       Therapeutic Activites    Therapeutic Activities Other Therapeutic Activities    Other Therapeutic Activities see education section      Neuro Re-ed    Neuro Re-ed Details  cued for body mechanics to minimize straining bladder                  HOME EXERCISE PROGRAM: See pt instruction section     ASSESSMENT:  CLINICAL IMPRESSION:  Pt is a  51  yo  who presents with SUI which impacts her ADls and QOL.  Pt's musculoskeletal assessment revealed abdominal scar restrictions, L deviated coccyx, hip weakness, dyscoordination and strength of pelvic floor mm, and  poor body mechanics which places strain on the abdominal/pelvic floor mm.   These are deficits that indicate an ineffective intraabdominal pressure system associated with increased risk for pt's Sx.   Pt would like to return to the gym. Pt will benefit from coordination training and education on fitness and functional positions in order to gain a more effective intraabdominal pressure system to minimize urinary leakage.  Pt was provided  education on etiology of Sx with anatomy, physiology explanation with images along with the benefits of customized pelvic PT Tx based on pt's medical conditions and musculoskeletal deficits.  Explained the physiology of deep core mm coordination and roles of pelvic floor function in urination, defecation, sexual function, and postural control with deep core mm system.   Following Tx today which pt tolerated without complaints,  pt demo'd proper body mechanics. Plan to address abdominal scar restrictions and deviated coccyx at next session.    OBJECTIVE IMPAIRMENTS decreased coordination, decreased endurance, decreased mobility, decreased ROM, decreased strength, decreased safety awareness, hypomobility, increased fascial restrictions, increased muscle spasms, improper body mechanics, and postural dysfunction.   ACTIVITY LIMITATIONS continence, toileting, and locomotion level  PARTICIPATION LIMITATIONS: interpersonal relationship, community activity, and occupation  PERSONAL FACTORS  Hx of fall onto her tailbone after falling ice in 2000. Vaginal deliveries for twins and one more child with tears, hysterectomy, appendectomy are also affecting patient's functional outcome.   REHAB POTENTIAL: Good  CLINICAL DECISION MAKING: Evolving/moderate complexity  EVALUATION COMPLEXITY: Moderate   PATIENT EDUCATION:   Education details: Showed pt anatomy images. Explained muscles attachments/ connection, physiology of deep core system/ spinal- thoracic-pelvis-lower kinetic chain as they relate to pt's presentation, Sx, and past Hx. Explained what and how these areas of deficits need to be restored to balance and function   See Therapeutic activity section  Person educated: Patient Education method: Explanation, Demonstration, Tactile cues, Verbal cues, and Handouts Education comprehension: verbalized understanding, returned demonstration, verbal cues required, tactile cues required, and needs further  education   PLAN: PT FREQUENCY: 1x/week  PT DURATION: 10 weeks  PLANNED INTERVENTIONS: Therapeutic exercises, Therapeutic activity, Neuromuscular re-education, Balance training, Gait training, Patient/Family education, Self Care, Joint mobilization, Spinal mobilization, Moist heat, scar mobilization, and Taping.  PLAN FOR NEXT SESSION:  See clinical impression for plan     GOALS: Goals reviewed with patient? Yes  SHORT TERM GOALS: Target date: 10/04/2021  Pt will demo IND with HEP Baseline: Not IND   Goal status: INITIAL   LONG TERM GOALS: Target date: 11/15/2021  Pt will demo proper deep core coordination without chest breathing and optimal excursion of diaphragm/pelvic floor in order to promote spinal stability and pelvic floor function   Baseline: poor coordination Goal status: INITIAL  2.  Pt will demo > 5 pt change on PFDI  and urinary problem FOTO to improve QOL and functional mobility   Baseline:  25 pts, 51 pts  Goal status: INITIAL  3.  Pt will report changing less pads from 1 per day to 0 pads in order to enjoy her community activities  Baseline: 1 pad Goal status: INITIAL  4.  Pt will demo medially aligned coccyx, stronger hip abduction mm B in order to progress to pelvic floor HEP/ deep core HEP  Baseline: coccyx deviated to L , hip abd 3+/5 B Goal status: INITIAL  5.  Pt will demo decreased abdominal scar restrictions in order to promote more facial mobility for deep core coordination  Baseline: restricted  Goal status: INITIAL  6.  Pt will demo proper body mechanics in against gravity tasks and ADLs, work tasks, and co-activation of deep core system with gym activities to minimize straining pelvic floor and in continence  Baseline: limited education  Goal status: INITIAL   Jerl Mina, PT 09/06/2021, 4:54 PM

## 2021-09-06 NOTE — Patient Instructions (Signed)
Avoid straining pelvic floor, abdominal muscles , spine  Use log rolling technique instead of getting out of bed with your neck or the sit-up     Log rolling into and out of bed   Log rolling into and out of bed If getting out of bed on R side, Bent knees, scoot hips/ shoulder to L  Raise R arm completely overhead, rolling onto armpit  Then lower bent knees to bed to get into complete side lying position  Then drop legs off bed, and push up onto R elbow/forearm, and use L hand to push onto the bed  __   Proper body mechanics with getting out of a chair to decrease strain  on back &pelvic floor   Avoid holding your breath when Getting out of the chair:  Scoot to front part of chair chair Heels behind knees, feet are hip width apart, nose over toes  Inhale like you are smelling roses Exhale to stand   __  Sitting with feet on ground, four points of contact Catch yourself crossing ankles and thighs  __  

## 2021-09-11 ENCOUNTER — Encounter: Payer: Self-pay | Admitting: Cardiovascular Disease

## 2021-09-11 ENCOUNTER — Ambulatory Visit (INDEPENDENT_AMBULATORY_CARE_PROVIDER_SITE_OTHER): Payer: No Typology Code available for payment source | Admitting: Cardiovascular Disease

## 2021-09-11 ENCOUNTER — Ambulatory Visit
Payer: No Typology Code available for payment source | Attending: Obstetrics and Gynecology | Admitting: Physical Therapy

## 2021-09-11 VITALS — BP 106/62 | HR 75 | Ht 61.0 in | Wt 139.0 lb

## 2021-09-11 DIAGNOSIS — R0602 Shortness of breath: Secondary | ICD-10-CM

## 2021-09-11 DIAGNOSIS — R278 Other lack of coordination: Secondary | ICD-10-CM | POA: Insufficient documentation

## 2021-09-11 DIAGNOSIS — I479 Paroxysmal tachycardia, unspecified: Secondary | ICD-10-CM

## 2021-09-11 DIAGNOSIS — R2689 Other abnormalities of gait and mobility: Secondary | ICD-10-CM | POA: Diagnosis present

## 2021-09-11 DIAGNOSIS — R002 Palpitations: Secondary | ICD-10-CM | POA: Diagnosis not present

## 2021-09-11 DIAGNOSIS — M533 Sacrococcygeal disorders, not elsewhere classified: Secondary | ICD-10-CM | POA: Insufficient documentation

## 2021-09-11 MED ORDER — PROPRANOLOL HCL 10 MG PO TABS: 10.0000 mg | ORAL_TABLET | Freq: Every day | ORAL | 3 refills | Status: DC | PRN

## 2021-09-11 NOTE — Patient Instructions (Signed)
Stretch for pelvic floor     On belly: Riding horse edge of mattress  knee bent like riding a horse, move knee towards armpit and out  10 reps     Frog stretch: laying on belly with pillow under hips, knees bent, I inhale do nothing, exhale let ankles fall apart 45 deg    10reps         childs poses rocking   Toes tucked, shoulders down and back, on forearms , hands shoulder width apart  10 reps    Happy baby  10 reps on your back

## 2021-09-11 NOTE — Patient Instructions (Addendum)
Medication Instructions:  No changes  If you need a refill on your cardiac medications before your next appointment, please call your pharmacy.   Lab work: No new labs needed  Testing/Procedures:  We will order CT coronary calcium score  $99 at our [facility] in [place]  Please call Colletta [place] at [phone] to schedule   [address] [place], [place] [address]   Follow-Up: At [facility], you and your health needs are our priority.  As part of our continuing mission to provide you with exceptional heart care, we have created designated Provider Care Teams.  These Care Teams include your primary Cardiologist (physician) and Advanced Practice Providers (APPs -  Physician Assistants and Nurse Practitioners) who all work together to provide you with the care you need, when you need it.  You will need a follow up appointment  as needed  Providers on your designated Care Team:   Murray Hodgkins, NP Christell Faith, PA-C Cadence Kathlen Mody, [place]  COVID-19 Vaccine Information can be found at: [email or web address] For questions related to vaccine distribution or appointments, please email [email or web address] or call [phone].

## 2021-09-11 NOTE — Therapy (Addendum)
OUTPATIENT PHYSICAL THERAPY Treatment   Patient Name: Jaime Fletcher MRN: [id number] DOB:1970-10-18, 51 y.o., female Today's Date: 09/11/2021   PT End of Session - 09/11/21 0849     Visit Number 2    Number of Visits 10    Date for PT Re-Evaluation 11/15/21    PT Start Time 2952    PT Stop Time 0930    PT Time Calculation (min) 43 min    Activity Tolerance Patient tolerated treatment well    Behavior During Therapy [facility] for tasks assessed/performed             Past Medical History:  Diagnosis Date   Abnormal Pap smear of cervix    dysplasia   ADD (attention deficit disorder)    B12 deficiency    Fibroid    GERD (gastroesophageal reflux disease)    Migraine headache    Pneumonia    PTSD (post-traumatic stress disorder)    PTSD (post-traumatic stress disorder)    Past Surgical History:  Procedure Laterality Date   ABDOMINAL HYSTERECTOMY  01/31/2020   APPENDECTOMY     AUGMENTATION MAMMAPLASTY Bilateral 2005   cold knife bx     DILATION AND CURETTAGE OF UTERUS     LAPAROSCOPIC VAGINAL HYSTERECTOMY WITH SALPINGECTOMY Bilateral 01/31/2020   Procedure: LAPAROSCOPIC ASSISTED VAGINAL HYSTERECTOMY WITH BILATERAL SALPINGECTOMY IUD REMOVAL;  Surgeon: Louretta Shorten, MD;  Location: [place];  Service: Gynecology;  Laterality: Bilateral;   SPINE SURGERY     C3-4 fusion   TONSILLECTOMY     Patient Active Problem List   Diagnosis Date Noted   S/P laparoscopic assisted vaginal hysterectomy (LAVH) 01/31/2020   Decreased libido 07/03/2018   Cervical stenosis (uterine cervix) 07/03/2018   IUD threads lost 07/03/2018   Breakthrough bleeding associated with intrauterine device (IUD) 07/03/2018   Left ovarian cyst 07/03/2018   Submucous leiomyoma of uterus 12/17/2017   Paroxysmal tachycardia ([place]) 09/09/2017   Chest pain 09/09/2017   Insomnia 09/09/2017   Depression 08/05/2017   Abnormal Pap smear of cervix 11/20/2016   Anxiety 11/20/2016   Fibromyalgia  11/20/2016   Encounter for insertion of intrauterine contraceptive device (IUD) 11/20/2016   B12 deficiency 06/19/2015   Vitamin D deficiency, unspecified 06/19/2015   Vitamin D deficiency 06/19/2015   Myopic astigmatism of both eyes 08/10/2014   Palpitations 05/19/2013   Attention deficit disorder 08/17/2010   Dysthymic disorder 08/17/2010   Obsessive-compulsive disorder 08/17/2010   Posttraumatic stress disorder 08/17/2010    PCP: Ellison Hughs MD   REFERRING PROVIDER: Louretta Shorten MD   REFERRING DIAG: N39.3 (ICD-10-CM) - Stress incontinence (female) (female)  Rationale for Evaluation and Treatment Rehabilitation  THERAPY DIAG:  Other lack of coordination  Sacrococcygeal disorders, not elsewhere classified  Other abnormalities of gait and mobility  ONSET DATE:  SUI started 2 years ago when she had fibroid the soft ball size which prompted the hysterectomy   SUBJECTIVE:  SUBJECTIVE STATEMENT: Pt noticed she became constipated last week without changes to diet.   PERTINENT HISTORY:  she had fibroid the soft ball size which prompted the hysterectomy.  Hx of appendectomy. C3-4 fusion.   PAIN:  Are you having pain? no   PRECAUTIONS: None  WEIGHT BEARING RESTRICTIONS No  FALLS:  Has patient fallen in last 6 months? No  LIVING ENVIRONMENT: Lives with: lives with their family Lives in: House/apartment Stairs: Yes: External: 3 steps; on right going up   OCCUPATION: administrative services   PLOF: Independent  PATIENT GOALS   Get a head of problems, eliminate leakage altogether, return to gym    OBJECTIVE:    [facility] PT Assessment - 09/11/21 0915       Palpation   SI assessment  tightness and tenderness along SIJ which was posteriorly rotated on L,  coccyx deviated to L, SIJ L  hypomobile by base, coccygeus tightness L             [facility] Adult PT Treatment/Exercise - 09/11/21 0915       Therapeutic Activites    Other Therapeutic Activities explained and showed anatomy with pelvic model on posterior pelvic floor mm, coccyx in relevance to new stretches      Neuro Re-ed    Neuro Re-ed Details  cued for posterior pelvic floor and nutation of sacrum      Modalities   Modalities Moist Heat      Moist Heat Therapy   Number Minutes Moist Heat 5 Minutes    Moist Heat Location --   in prone, pillow under belly for comfort, unbilled     Manual Therapy   Manual therapy comments STM/MWM at problem areas noted in assessment to promote alignment of coccyx and nutaiton of sacrum, mobility of posterior pelvic floor mm                 HOME EXERCISE PROGRAM: See pt instruction section     ASSESSMENT:  CLINICAL IMPRESSION:  Pt  required manual Tx to address L deviated coccyx, posteriorly rotated SIJ on L, tight posterior pelvic floor mm. Pt demo'd more medially aligned coccyx, more nutated SIJ, decreased pelvic floor mm and less tenderness post Tx. Anticipate this correction will help with the misalignment related to a previous fall onto her tailbone. Cued pt to not hyperextend her knees which leads to tighter posterior mm of pelvic floor. Explained importance of anterior tilt for bowel movements and lengthened pelvic floor. Pt demo'd form for HEP after cues. Pt continues to benefit from skilled PT. Plan to progress to deep core training.    OBJECTIVE IMPAIRMENTS decreased coordination, decreased endurance, decreased mobility, decreased ROM, decreased strength, decreased safety awareness, hypomobility, increased fascial restrictions, increased muscle spasms, improper body mechanics, and postural dysfunction.   ACTIVITY LIMITATIONS continence, toileting, and locomotion level  PARTICIPATION LIMITATIONS: interpersonal relationship, community activity, and  occupation  PERSONAL FACTORS  Hx of fall onto her tailbone after falling ice in 2000. Vaginal deliveries for twins and one more child with tears, hysterectomy, appendectomy are also affecting patient's functional outcome.   REHAB POTENTIAL: Good  CLINICAL DECISION MAKING: Evolving/moderate complexity  EVALUATION COMPLEXITY: Moderate   PATIENT EDUCATION:   Education details: Showed pt anatomy images. Explained muscles attachments/ connection, physiology of deep core system/ spinal- thoracic-pelvis-lower kinetic chain as they relate to pt's presentation, Sx, and past Hx. Explained what and how these areas of deficits need to be restored to balance and function   See Therapeutic activity /  Neuromuscular education section  Person educated: Patient Education method: Explanation, Demonstration, Tactile cues, Verbal cues, and Handouts Education comprehension: verbalized understanding, returned demonstration, verbal cues required, tactile cues required, and needs further education   PLAN: PT FREQUENCY: 1x/week  PT DURATION: 10 weeks  PLANNED INTERVENTIONS: Therapeutic exercises, Therapeutic activity, Neuromuscular re-education, Balance training, Gait training, Patient/Family education, Self Care, Joint mobilization, Spinal mobilization, Moist heat, scar mobilization, and Taping.  PLAN FOR NEXT SESSION:  See clinical impression for plan     GOALS: Goals reviewed with patient? Yes  SHORT TERM GOALS: Target date: 10/09/2021  Pt will demo IND with HEP Baseline: Not IND   Goal status: INITIAL   LONG TERM GOALS: Target date: 11/20/2021  Pt will demo proper deep core coordination without chest breathing and optimal excursion of diaphragm/pelvic floor in order to promote spinal stability and pelvic floor function   Baseline: poor coordination Goal status: INITIAL  2.  Pt will demo > 5 pt change on PFDI  and urinary problem FOTO to improve QOL and functional mobility   Baseline:  25  pts, 51 pts  Goal status: INITIAL  3.  Pt will report changing less pads from 1 per day to 0 pads in order to enjoy her community activities  Baseline: 1 pad Goal status: INITIAL  4.  Pt will demo medially aligned coccyx, stronger hip abduction mm B in order to progress to pelvic floor HEP/ deep core HEP  Baseline: coccyx deviated to L , hip abd 3+/5 B Goal status: INITIAL  5.  Pt will demo decreased abdominal scar restrictions in order to promote more facial mobility for deep core coordination  Baseline: restricted  Goal status: INITIAL  6.  Pt will demo proper body mechanics in against gravity tasks and ADLs, work tasks, and co-activation of deep core system with gym activities to minimize straining pelvic floor and in continence  Baseline: limited education  Goal status: INITIAL   Jerl Mina, PT 09/11/2021, 10:31 AM

## 2021-09-11 NOTE — Progress Notes (Addendum)
Cardiology Office Note  Date:  09/11/2021   ID:  Jaime Fletcher, DOB 1970-08-27, MRN [id number]  PCP:  [name], MD   Chief Complaint  Patient presents with   New Patient (Initial Visit)    Ref by Dr. Maryjane Hurter for palpations. Patient c/o shortness of breath with occasional palpitations. Medications reviewed by the patient verbally.      HPI:  [name] is a 51 yo woman with past medical history of PTSD ADD OCD/anxiety Palpitations Who presents by referral by Dr. Maryjane Hurter for consultation of her tachycardia and chest pain  Previously seen in 2019 for tachycardia, ectopy  Difficult year, lost her mother last year, as only child had to help settle the estate Has been able to wean herself off benzodiazepines, feels her anxiety is much better controlled now Last year was having more difficulty with shortness of breath, tachycardia Reported having episodes of sob in shower shaving, was sick at the time Heart rates up and down at times Has a watch that will help track heart rate, periodically with elevated rates for unclear reasons  Symptoms seem to have gotten better over the past several months Rarely takes propranolol 5 mg Has phentermine, but has not been using it on a regular basis at full dose Ambien as needed for sleep  Family history: Mother with history of cardiac disease  EKG personally reviewed by myself on todays visit Normal sinus rhythm rate 75 bpm no significant ST-T wave changes   PMH:   has a past medical history of Abnormal Pap smear of cervix, ADD (attention deficit disorder), B12 deficiency, Fibroid, GERD (gastroesophageal reflux disease), Migraine headache, Pneumonia, PTSD (post-traumatic stress disorder), and PTSD (post-traumatic stress disorder).  PSH:    Past Surgical History:  Procedure Laterality Date   ABDOMINAL HYSTERECTOMY  01/31/2020   APPENDECTOMY     AUGMENTATION MAMMAPLASTY Bilateral 2005   cold knife bx     DILATION AND  CURETTAGE OF UTERUS     LAPAROSCOPIC VAGINAL HYSTERECTOMY WITH SALPINGECTOMY Bilateral 01/31/2020   Procedure: LAPAROSCOPIC ASSISTED VAGINAL HYSTERECTOMY WITH BILATERAL SALPINGECTOMY IUD REMOVAL;  Surgeon: Candice Camp, MD;  Location: [facility]  Service: Gynecology;  Laterality: Bilateral;   SPINE SURGERY     C3-4 fusion   TONSILLECTOMY      Current Outpatient Medications  Medication Sig Dispense Refill   Calcium Citrate-Vitamin D3 (SM CALCIUM CITRATE+VIT D3 MAX) 315-6.25 MG-MCG TABS Take by mouth.     estradiol (VIVELLE-DOT) 0.1 MG/24HR patch Place 1 patch onto the skin twice a week as directed 8 patch 6   fluticasone (FLONASE) 50 MCG/ACT nasal spray INSTILL 2 SPRAYS INTO EACH NOSTRIL DAILYAT BEDTIME (Patient taking differently: Place 2 sprays into both nostrils daily as needed (allergies.).) 11.1 mL 11   hydrOXYzine (ATARAX/VISTARIL) 50 MG tablet Take 25-50 mg by mouth 2 (two) times daily as needed for anxiety.     ibuprofen (ADVIL) 600 MG tablet Take 1 tablet (600 mg total) by mouth every 6 (six) hours as needed for mild pain or moderate pain. 30 tablet 0   MAGNESIUM PO Take 325 mg by mouth every evening. Powder     pantoprazole (PROTONIX) 40 MG tablet Take 1 tablet (40 mg total) by mouth daily. 90 tablet 1   phentermine (ADIPEX-P) 37.5 MG tablet Take 37.5 mg by mouth every morning.     Probiotic Product (PROBIOTIC PEARLS) CAPS Take by mouth.     progesterone (PROMETRIUM) 100 MG capsule Take 1 capsule (100 mg  total) by mouth at bedtime. 30 capsule 6   propranolol (INDERAL) 10 MG tablet Take 10 mg by mouth daily as needed for anxiety.     zolpidem (AMBIEN) 10 MG tablet Take 10 mg by mouth at bedtime as needed.     amoxicillin-clavulanate (AUGMENTIN) 875-125 MG tablet Take 1 tablet by mouth 2 (two) times daily. (Patient not taking: Reported on 09/06/2021) 20 tablet 0   cyanocobalamin (,VITAMIN B-12,) 1000 MCG/ML injection Inject 1 mL (1,000 mcg total) into the muscle every 30  (thirty) days. (Patient not taking: Reported on 09/06/2021) 1 mL 5   diphenhydramine-acetaminophen (TYLENOL PM) 25-500 MG TABS tablet Take 1 tablet by mouth at bedtime as needed. (Patient not taking: Reported on 09/06/2021)     Ibuprofen-diphenhydrAMINE HCl (ADVIL PM) 200-25 MG CAPS Take 1 tablet by mouth at bedtime as needed (sleep). (Patient not taking: Reported on 09/06/2021)     L-THEANINE PO Take 0.5 drops by mouth at bedtime as needed (rest/anxiousness). 0.5 dropper (Patient not taking: Reported on 09/06/2021)     traZODone (DESYREL) 50 MG tablet Take 50 mg by mouth at bedtime as needed for sleep. (Patient not taking: Reported on 09/06/2021)     Vitamin D, Ergocalciferol, (DRISDOL) 1.25 MG (50000 UNIT) CAPS capsule Take 50,000 Units by mouth every Wednesday. (Patient not taking: Reported on 09/06/2021)     No current facility-administered medications for this visit.    Allergies:   Hydrocodone-acetaminophen, Prednisone, and Amitriptyline   Social History:  The patient  reports that she has never smoked. She has never used smokeless tobacco. She reports current alcohol use. She reports that she does not use drugs.   Family History:   family history includes Atrial fibrillation in her father; Breast cancer in her paternal aunt; Diabetes in her mother; Heart attack in her mother; Heart disease in her mother; Heart failure in her maternal grandmother; Hypertension in her father and mother.    Review of Systems: Review of Systems  Constitutional: Negative.   HENT: Negative.    Respiratory: Negative.    Cardiovascular:  Positive for palpitations.       Tachycardia  Gastrointestinal: Negative.   Musculoskeletal: Negative.   Neurological: Negative.   Psychiatric/Behavioral: Negative.    All other systems reviewed and are negative.   PHYSICAL EXAM: VS:  BP 106/62 (BP Location: Right Arm, Patient Position: Sitting, Cuff Size: Normal)   Pulse 75   Ht 5\' 1"  (1.549 m)   Wt 139 lb (63 kg)   LMP  01/20/2020   SpO2 96%   BMI 26.26 kg/m  , BMI Body mass index is 26.26 kg/m. Constitutional:  oriented to person, place, and time. No distress.  HENT:  Head: Grossly normal Eyes:  no discharge. No scleral icterus.  Neck: No JVD, no carotid bruits  Cardiovascular: Regular rate and rhythm, no murmurs appreciated Pulmonary/Chest: Clear to auscultation bilaterally, no wheezes or rails Abdominal: Soft.  no distension.  no tenderness.  Musculoskeletal: Normal range of motion Neurological:  normal muscle tone. Coordination normal. No atrophy Skin: Skin warm and dry Psychiatric: normal affect, pleasant   Recent Labs: 08/01/2021: ALT 23; BUN 15; Creatinine, Ser 0.64; Hemoglobin 13.9; Platelets 276; Potassium 4.1; Sodium 138    Lipid Panel Lab Results  Component Value Date   CHOL 191 08/01/2021   HDL 47 08/01/2021   LDLCALC 117 (H) 08/01/2021   TRIG 133 08/01/2021      Wt Readings from Last 3 Encounters:  09/11/21 139 lb (63 kg)  04/03/20  140 lb (63.5 kg)  03/13/20 142 lb (64.4 kg)      ASSESSMENT AND PLAN:  Palpitations /paroxysmal tachycardia Prior long-term monitor showed rare PACs, PVCs Symptoms seem to have improved over the past 6 months to 1 year Does not need medication on a daily basis, likely best to take propranolol 5 up to 10 mg as needed for symptomatic palpitations/tachycardia For worsening symptoms, repeat Zio monitor could be ordered  Anxiety Managed by psychiatry Has been able to wean herself off many of her medications, feels anxiety well controlled  Chest pain, unspecified type Prior history of chest pain, no recent episodes warranting evaluation Active at baseline with no anginal symptoms CT coronary calcium scoring ordered given family history, mother  Primary insomnia Treated with Ambien by psychiatry  Patient seen in consultation for Dr. Maudie Flakes will be referred back to his office for ongoing care of the issues detailed above   No  orders of the defined types were placed in this encounter.    Signed, Dossie Arbour, M.D., Ph.D. 09/11/2021  [facility] [place], [place] [phone]

## 2021-09-12 NOTE — Addendum Note (Signed)
Addended by: Anselm Pancoast on: 09/12/2021 10:40 AM   Modules accepted: Orders

## 2021-09-13 ENCOUNTER — Other Ambulatory Visit: Payer: Self-pay

## 2021-09-14 ENCOUNTER — Other Ambulatory Visit: Payer: Self-pay

## 2021-09-19 ENCOUNTER — Other Ambulatory Visit: Payer: Self-pay

## 2021-09-20 ENCOUNTER — Other Ambulatory Visit: Payer: Self-pay

## 2021-09-20 ENCOUNTER — Ambulatory Visit: Payer: No Typology Code available for payment source | Admitting: Physical Therapy

## 2021-09-20 DIAGNOSIS — M533 Sacrococcygeal disorders, not elsewhere classified: Secondary | ICD-10-CM

## 2021-09-20 DIAGNOSIS — R278 Other lack of coordination: Secondary | ICD-10-CM

## 2021-09-20 DIAGNOSIS — R2689 Other abnormalities of gait and mobility: Secondary | ICD-10-CM

## 2021-09-20 MED ORDER — PHENTERMINE HCL 37.5 MG PO TABS
ORAL_TABLET | ORAL | 2 refills | Status: DC
  Filled 2021-09-20: qty 30, 30d supply, fill #0
  Filled 2021-11-01: qty 30, 30d supply, fill #1
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
## 2021-09-20 NOTE — Patient Instructions (Signed)
Stretch for pelvic floor   V- slides  "v heels slide away and then back toward buttocks and then rock knee to slight ,  slide heel along at 11 o clock away from buttocks   10 reps   __  Deep core level 1-2  (handout)

## 2021-09-21 ENCOUNTER — Other Ambulatory Visit: Payer: Self-pay

## 2021-09-25 ENCOUNTER — Ambulatory Visit: Payer: No Typology Code available for payment source | Admitting: Physical Therapy

## 2021-09-25 DIAGNOSIS — M533 Sacrococcygeal disorders, not elsewhere classified: Secondary | ICD-10-CM

## 2021-09-25 DIAGNOSIS — R2689 Other abnormalities of gait and mobility: Secondary | ICD-10-CM

## 2021-09-25 DIAGNOSIS — R278 Other lack of coordination: Secondary | ICD-10-CM | POA: Diagnosis not present

## 2021-09-25 NOTE — Therapy (Signed)
OUTPATIENT PHYSICAL THERAPY Treatment   Patient Name: SHABANA ARMENTROUT MRN: [id number] DOB:02-07-1971, 51 y.o., female Today's Date: 09/25/2021   PT End of Session - 09/25/21 1507     Visit Number 4    Number of Visits 10    Date for PT Re-Evaluation 11/15/21    PT Start Time 14    PT Stop Time 8891    PT Time Calculation (min) 42 min    Activity Tolerance Patient tolerated treatment well    Behavior During Therapy WFL for tasks assessed/performed             Past Medical History:  Diagnosis Date   Abnormal Pap smear of cervix    dysplasia   ADD (attention deficit disorder)    B12 deficiency    Fibroid    GERD (gastroesophageal reflux disease)    Migraine headache    Pneumonia    PTSD (post-traumatic stress disorder)    PTSD (post-traumatic stress disorder)    Past Surgical History:  Procedure Laterality Date   ABDOMINAL HYSTERECTOMY  01/31/2020   APPENDECTOMY     AUGMENTATION MAMMAPLASTY Bilateral 2005   cold knife bx     DILATION AND CURETTAGE OF UTERUS     LAPAROSCOPIC VAGINAL HYSTERECTOMY WITH SALPINGECTOMY Bilateral 01/31/2020   Procedure: LAPAROSCOPIC ASSISTED VAGINAL HYSTERECTOMY WITH BILATERAL SALPINGECTOMY IUD REMOVAL;  Surgeon: Louretta Shorten, MD;  Location: [place];  Service: Gynecology;  Laterality: Bilateral;   SPINE SURGERY     C3-4 fusion   TONSILLECTOMY     Patient Active Problem List   Diagnosis Date Noted   S/P laparoscopic assisted vaginal hysterectomy (LAVH) 01/31/2020   Decreased libido 07/03/2018   Cervical stenosis (uterine cervix) 07/03/2018   IUD threads lost 07/03/2018   Breakthrough bleeding associated with intrauterine device (IUD) 07/03/2018   Left ovarian cyst 07/03/2018   Submucous leiomyoma of uterus 12/17/2017   Paroxysmal tachycardia ([place]) 09/09/2017   Chest pain 09/09/2017   Insomnia 09/09/2017   Depression 08/05/2017   Abnormal Pap smear of cervix 11/20/2016   Anxiety 11/20/2016   Fibromyalgia  11/20/2016   Encounter for insertion of intrauterine contraceptive device (IUD) 11/20/2016   B12 deficiency 06/19/2015   Vitamin D deficiency, unspecified 06/19/2015   Vitamin D deficiency 06/19/2015   Myopic astigmatism of both eyes 08/10/2014   Palpitations 05/19/2013   Attention deficit disorder 08/17/2010   Dysthymic disorder 08/17/2010   Obsessive-compulsive disorder 08/17/2010   Posttraumatic stress disorder 08/17/2010    PCP: Ellison Hughs MD   REFERRING PROVIDER: Louretta Shorten MD   REFERRING DIAG: N39.3 (ICD-10-CM) - Stress incontinence (female) (female)  Rationale for Evaluation and Treatment Rehabilitation  THERAPY DIAG:  Sacrococcygeal disorders, not elsewhere classified  Other abnormalities of gait and mobility  Other lack of coordination  ONSET DATE:  SUI started 2 years ago when she had fibroid the soft ball size which prompted the hysterectomy   SUBJECTIVE:  SUBJECTIVE STATEMENT: Pt didn ot get to do her HEP last week due to a stressful week.  PERTINENT HISTORY:  she had fibroid the soft ball size which prompted the hysterectomy.  Hx of appendectomy. C3-4 fusion.   PAIN:  Are you having pain? no   PRECAUTIONS: None  WEIGHT BEARING RESTRICTIONS No  FALLS:  Has patient fallen in last 6 months? No  LIVING ENVIRONMENT: Lives with: lives with their family Lives in: House/apartment Stairs: Yes: External: 3 steps; on right going up   OCCUPATION: administrative services   PLOF: Independent  PATIENT GOALS   Get ahead of problems, eliminate leakage altogether, return to gym    OBJECTIVE:    [place] - 09/25/21 1650       Coordination   Coordination and Movement Description chest breathing      Palpation   Palpation comment restricted abdominal scar R  lower quadrant ( decreased post Tx)                [facility] Adult PT Treatment/Exercise - 09/25/21 1651       Therapeutic Activites    Other Therapeutic Activities explained role of anxiety with pelvic floor overactivity/ constipation, biopsychosocial approaches , active listening to pt's stress management strategies      Neuro Re-ed    Neuro Re-ed Details  cued for diaphragm breathing, new HEP for relaxation of abdominal area, self massage over scar ,      Manual Therapy   Manual therapy comments fascial releases over abdominal scar                 HOME EXERCISE PROGRAM: See pt instruction section     ASSESSMENT:  CLINICAL IMPRESSION: Focused on decreasing abdominal scar restrictions today and provided relaxation practices to minimize chest breathing and increase stress management. Pt demo'd decreased scar restrictions over R lower abdominal scar and less chest breating post Tx. Plan to add deep core level 2 training and continue with decreasing pelvic floor mm tightness at next session.   Pt continues to benefit from skilled PT.    OBJECTIVE IMPAIRMENTS decreased coordination, decreased endurance, decreased mobility, decreased ROM, decreased strength, decreased safety awareness, hypomobility, increased fascial restrictions, increased muscle spasms, improper body mechanics, and postural dysfunction.   ACTIVITY LIMITATIONS continence, toileting, and locomotion level  PARTICIPATION LIMITATIONS: interpersonal relationship, community activity, and occupation  PERSONAL FACTORS  Hx of fall onto her tailbone after falling ice in 2000. Vaginal deliveries for twins and one more child with tears, hysterectomy, appendectomy are also affecting patient's functional outcome.   REHAB POTENTIAL: Good  CLINICAL DECISION MAKING: Evolving/moderate complexity  EVALUATION COMPLEXITY: Moderate   PATIENT EDUCATION:   Education details: Showed pt anatomy images. Explained muscles  attachments/ connection, physiology of deep core system/ spinal- thoracic-pelvis-lower kinetic chain as they relate to pt's presentation, Sx, and past Hx. Explained what and how these areas of deficits need to be restored to balance and function   See Therapeutic activity / Neuromuscular education section  Person educated: Patient Education method: Explanation, Demonstration, Tactile cues, Verbal cues, and Handouts Education comprehension: verbalized understanding, returned demonstration, verbal cues required, tactile cues required, and needs further education   PLAN: PT FREQUENCY: 1x/week  PT DURATION: 10 weeks  PLANNED INTERVENTIONS: Therapeutic exercises, Therapeutic activity, Neuromuscular re-education, Balance training, Gait training, Patient/Family education, Self Care, Joint mobilization, Spinal mobilization, Moist heat, scar mobilization, and Taping.  PLAN FOR NEXT SESSION:  See clinical impression for plan     GOALS: Goals  reviewed with patient? Yes  SHORT TERM GOALS: Target date: 10/23/2021  Pt will demo IND with HEP Baseline: Not IND   Goal status: INITIAL   LONG TERM GOALS: Target date: 12/04/2021  Pt will demo proper deep core coordination without chest breathing and optimal excursion of diaphragm/pelvic floor in order to promote spinal stability and pelvic floor function   Baseline: poor coordination Goal status: INITIAL  2.  Pt will demo > 5 pt change on PFDI  and urinary problem FOTO to improve QOL and functional mobility   Baseline:  25 pts, 51 pts  Goal status: INITIAL  3.  Pt will report changing less pads from 1 per day to 0 pads in order to enjoy her community activities  Baseline: 1 pad Goal status: INITIAL  4.  Pt will demo medially aligned coccyx, stronger hip abduction mm B in order to progress to pelvic floor HEP/ deep core HEP  Baseline: coccyx deviated to L , hip abd 3+/5 B Goal status: INITIAL  5.  Pt will demo decreased abdominal scar  restrictions in order to promote more facial mobility for deep core coordination  Baseline: restricted  Goal status: INITIAL  6.  Pt will demo proper body mechanics in against gravity tasks and ADLs, work tasks, and co-activation of deep core system with gym activities to minimize straining pelvic floor and in continence  Baseline: limited education  Goal status: INITIAL   Jerl Mina, PT 09/25/2021, 5:11 PM

## 2021-10-02 ENCOUNTER — Ambulatory Visit: Payer: No Typology Code available for payment source | Admitting: Physical Therapy

## 2021-10-02 DIAGNOSIS — R2689 Other abnormalities of gait and mobility: Secondary | ICD-10-CM

## 2021-10-02 DIAGNOSIS — R278 Other lack of coordination: Secondary | ICD-10-CM | POA: Diagnosis not present

## 2021-10-02 DIAGNOSIS — M533 Sacrococcygeal disorders, not elsewhere classified: Secondary | ICD-10-CM

## 2021-10-02 NOTE — Therapy (Signed)
OUTPATIENT PHYSICAL THERAPY Treatment   Patient Name: Jaime Fletcher MRN: [id number] DOB:Oct 01, 1970, 51 y.o., female Today's Date: 10/02/2021   PT End of Session - 10/02/21 0500     Visit Number 5    Number of Visits 10    Date for PT Re-Evaluation 11/15/21    PT Start Time 8850    PT Stop Time 1548    PT Time Calculation (min) 43 min    Activity Tolerance Patient tolerated treatment well    Behavior During Therapy WFL for tasks assessed/performed             Past Medical History:  Diagnosis Date   Abnormal Pap smear of cervix    dysplasia   ADD (attention deficit disorder)    B12 deficiency    Fibroid    GERD (gastroesophageal reflux disease)    Migraine headache    Pneumonia    PTSD (post-traumatic stress disorder)    PTSD (post-traumatic stress disorder)    Past Surgical History:  Procedure Laterality Date   ABDOMINAL HYSTERECTOMY  01/31/2020   APPENDECTOMY     AUGMENTATION MAMMAPLASTY Bilateral 2005   cold knife bx     DILATION AND CURETTAGE OF UTERUS     LAPAROSCOPIC VAGINAL HYSTERECTOMY WITH SALPINGECTOMY Bilateral 01/31/2020   Procedure: LAPAROSCOPIC ASSISTED VAGINAL HYSTERECTOMY WITH BILATERAL SALPINGECTOMY IUD REMOVAL;  Surgeon: Louretta Shorten, MD;  Location: [facility];  Service: Gynecology;  Laterality: Bilateral;   SPINE SURGERY     C3-4 fusion   TONSILLECTOMY     Patient Active Problem List   Diagnosis Date Noted   S/P laparoscopic assisted vaginal hysterectomy (LAVH) 01/31/2020   Decreased libido 07/03/2018   Cervical stenosis (uterine cervix) 07/03/2018   IUD threads lost 07/03/2018   Breakthrough bleeding associated with intrauterine device (IUD) 07/03/2018   Left ovarian cyst 07/03/2018   Submucous leiomyoma of uterus 12/17/2017   Paroxysmal tachycardia ([place]) 09/09/2017   Chest pain 09/09/2017   Insomnia 09/09/2017   Depression 08/05/2017   Abnormal Pap smear of cervix 11/20/2016   Anxiety 11/20/2016   Fibromyalgia  11/20/2016   Encounter for insertion of intrauterine contraceptive device (IUD) 11/20/2016   B12 deficiency 06/19/2015   Vitamin D deficiency, unspecified 06/19/2015   Vitamin D deficiency 06/19/2015   Myopic astigmatism of both eyes 08/10/2014   Palpitations 05/19/2013   Attention deficit disorder 08/17/2010   Dysthymic disorder 08/17/2010   Obsessive-compulsive disorder 08/17/2010   Posttraumatic stress disorder 08/17/2010    PCP: Ellison Hughs MD   REFERRING PROVIDER: Louretta Shorten MD   REFERRING DIAG: N39.3 (ICD-10-CM) - Stress incontinence (female) (female)  Rationale for Evaluation and Treatment Rehabilitation  THERAPY DIAG:  Sacrococcygeal disorders, not elsewhere classified  Other abnormalities of gait and mobility  Other lack of coordination  ONSET DATE:  SUI started 2 years ago when she had fibroid the soft ball size which prompted the hysterectomy   SUBJECTIVE:  SUBJECTIVE STATEMENT: Pt notices she leaks more when she drinks green tea.   PERTINENT HISTORY:  she had fibroid the soft ball size which prompted the hysterectomy.  Hx of appendectomy. C3-4 fusion.   PAIN:  Are you having pain? no   PRECAUTIONS: None  WEIGHT BEARING RESTRICTIONS No  FALLS:  Has patient fallen in last 6 months? No  LIVING ENVIRONMENT: Lives with: lives with their family Lives in: House/apartment Stairs: Yes: External: 3 steps; on right going up   OCCUPATION: administrative services   PLOF: Independent  PATIENT GOALS   Get ahead of problems, eliminate leakage altogether, return to gym    OBJECTIVE:    [place] - 10/02/21 1651       Coordination   Coordination and Movement Description limited abdominal wall mobility with coordination      Palpation   Palpation comment  restricted abdominal scar R lower quadrant ( decreased post Tx)                 [facility] Adult PT Treatment/Exercise - 10/02/21 1652       Therapeutic Activites    Other Therapeutic Activities explained upcoming session to assess pelvic floor and what that entails      Neuro Re-ed    Neuro Re-ed Details  cued for abdominal massage over scar, cued for more deep core coordination for ab and pelvic floor  co-activation      Manual Therapy   Manual therapy comments fascial releases over abdominal scar                  HOME EXERCISE PROGRAM: See pt instruction section     ASSESSMENT:  CLINICAL IMPRESSION: Focused on decreasing abdominal scar restrictions further today. Pt demo'd decreased scar restrictions over R lower abdominal scar and more co-activation of TrA mm with deep core coordination post Tx. Plan to perform internal pelvic floor mm assessment at next session.  Pt continues to benefit from skilled PT.    OBJECTIVE IMPAIRMENTS decreased coordination, decreased endurance, decreased mobility, decreased ROM, decreased strength, decreased safety awareness, hypomobility, increased fascial restrictions, increased muscle spasms, improper body mechanics, and postural dysfunction.   ACTIVITY LIMITATIONS continence, toileting, and locomotion level  PARTICIPATION LIMITATIONS: interpersonal relationship, community activity, and occupation  PERSONAL FACTORS  Hx of fall onto her tailbone after falling ice in 2000. Vaginal deliveries for twins and one more child with tears, hysterectomy, appendectomy are also affecting patient's functional outcome.   REHAB POTENTIAL: Good  CLINICAL DECISION MAKING: Evolving/moderate complexity  EVALUATION COMPLEXITY: Moderate   PATIENT EDUCATION:   Education details: Showed pt anatomy images. Explained muscles attachments/ connection, physiology of deep core system/ spinal- thoracic-pelvis-lower kinetic chain as they relate to pt's  presentation, Sx, and past Hx. Explained what and how these areas of deficits need to be restored to balance and function   See Therapeutic activity / Neuromuscular education section  Person educated: Patient Education method: Explanation, Demonstration, Tactile cues, Verbal cues, and Handouts Education comprehension: verbalized understanding, returned demonstration, verbal cues required, tactile cues required, and needs further education   PLAN: PT FREQUENCY: 1x/week  PT DURATION: 10 weeks  PLANNED INTERVENTIONS: Therapeutic exercises, Therapeutic activity, Neuromuscular re-education, Balance training, Gait training, Patient/Family education, Self Care, Joint mobilization, Spinal mobilization, Moist heat, scar mobilization, and Taping.  PLAN FOR NEXT SESSION:  See clinical impression for plan     GOALS: Goals reviewed with patient? Yes  SHORT TERM GOALS: Target date: 10/30/2021  Pt will demo IND with  HEP Baseline: Not IND   Goal status: INITIAL   LONG TERM GOALS: Target date: 12/11/2021  Pt will demo proper deep core coordination without chest breathing and optimal excursion of diaphragm/pelvic floor in order to promote spinal stability and pelvic floor function   Baseline: poor coordination Goal status: INITIAL  2.  Pt will demo > 5 pt change on PFDI  and urinary problem FOTO to improve QOL and functional mobility   Baseline:  25 pts, 51 pts  Goal status: INITIAL  3.  Pt will report changing less pads from 1 per day to 0 pads in order to enjoy her community activities  Baseline: 1 pad Goal status: INITIAL  4.  Pt will demo medially aligned coccyx, stronger hip abduction mm B in order to progress to pelvic floor HEP/ deep core HEP  Baseline: coccyx deviated to L , hip abd 3+/5 B Goal status: INITIAL  5.  Pt will demo decreased abdominal scar restrictions in order to promote more facial mobility for deep core coordination  Baseline: restricted  Goal status:  INITIAL  6.  Pt will demo proper body mechanics in against gravity tasks and ADLs, work tasks, and co-activation of deep core system with gym activities to minimize straining pelvic floor and in continence  Baseline: limited education  Goal status: INITIAL   Jerl Mina, PT 10/02/2021, 4:53 PM

## 2021-10-03 ENCOUNTER — Other Ambulatory Visit: Payer: Self-pay

## 2021-10-04 ENCOUNTER — Other Ambulatory Visit: Payer: Self-pay

## 2021-10-04 ENCOUNTER — Telehealth: Payer: Self-pay

## 2021-10-04 DIAGNOSIS — Z1211 Encounter for screening for malignant neoplasm of colon: Secondary | ICD-10-CM

## 2021-10-04 MED ORDER — CLENPIQ 10-3.5-12 MG-GM -GM/160ML PO SOLN
320.0000 mL | ORAL | 0 refills | Status: DC
  Filled 2021-10-04 – 2021-10-08 (×2): qty 320, 1d supply, fill #0

## 2021-10-04 NOTE — Telephone Encounter (Signed)
Gastroenterology Pre-Procedure Review  Request Date: 12/03/21 Requesting Physician: Dr. Vicente Males  PATIENT REVIEW QUESTIONS: The patient responded to the following health history questions as indicated:    1. Are you having any GI issues? no 2. Do you have a personal history of Polyps? no 3. Do you have a family history of Colon Cancer or Polyps? yes (colon cancer) 4. Diabetes Mellitus? no 5. Joint replacements in the past 12 months?no 6. Major health problems in the past 3 months?no 7. Any artificial heart valves, MVP, or defibrillator?no    MEDICATIONS & ALLERGIES:    Patient reports the following regarding taking any anticoagulation/antiplatelet therapy:   Plavix, Coumadin, Eliquis, Xarelto, Lovenox, Pradaxa, Brilinta, or Effient? no Aspirin? no  Patient confirms/reports the following medications:  Current Outpatient Medications  Medication Sig Dispense Refill   amoxicillin-clavulanate (AUGMENTIN) 875-125 MG tablet Take 1 tablet by mouth 2 (two) times daily. (Patient not taking: Reported on 09/06/2021) 20 tablet 0   Calcium Citrate-Vitamin D3 (SM CALCIUM CITRATE+VIT D3 MAX) 315-6.25 MG-MCG TABS Take by mouth.     cyanocobalamin (,VITAMIN B-12,) 1000 MCG/ML injection Inject 1 mL (1,000 mcg total) into the muscle every 30 (thirty) days. (Patient not taking: Reported on 09/06/2021) 1 mL 5   diphenhydramine-acetaminophen (TYLENOL PM) 25-500 MG TABS tablet Take 1 tablet by mouth at bedtime as needed. (Patient not taking: Reported on 09/06/2021)     estradiol (VIVELLE-DOT) 0.1 MG/24HR patch Place 1 patch onto the skin twice a week as directed 8 patch 6   fluticasone (FLONASE) 50 MCG/ACT nasal spray INSTILL 2 SPRAYS INTO EACH NOSTRIL DAILYAT BEDTIME (Patient taking differently: Place 2 sprays into both nostrils daily as needed (allergies.).) 11.1 mL 11   hydrOXYzine (ATARAX/VISTARIL) 50 MG tablet Take 25-50 mg by mouth 2 (two) times daily as needed for anxiety.     ibuprofen (ADVIL) 600 MG tablet  Take 1 tablet (600 mg total) by mouth every 6 (six) hours as needed for mild pain or moderate pain. 30 tablet 0   Ibuprofen-diphenhydrAMINE HCl (ADVIL PM) 200-25 MG CAPS Take 1 tablet by mouth at bedtime as needed (sleep). (Patient not taking: Reported on 09/06/2021)     L-THEANINE PO Take 0.5 drops by mouth at bedtime as needed (rest/anxiousness). 0.5 dropper (Patient not taking: Reported on 09/06/2021)     MAGNESIUM PO Take 325 mg by mouth every evening. Powder     pantoprazole (PROTONIX) 40 MG tablet Take 1 tablet (40 mg total) by mouth daily. 90 tablet 1   phentermine (ADIPEX-P) 37.5 MG tablet Take 37.5 mg by mouth every morning.     phentermine (ADIPEX-P) 37.5 MG tablet Take 1 tablet by mouth in the morning 30 tablet 2   Probiotic Product (PROBIOTIC PEARLS) CAPS Take by mouth.     progesterone (PROMETRIUM) 100 MG capsule Take 1 capsule (100 mg total) by mouth at bedtime. 30 capsule 6   propranolol (INDERAL) 10 MG tablet Take 1 tablet (10 mg total) by mouth daily as needed. 90 tablet 3   Sod Picosulfate-Mag Ox-Cit Acd (CLENPIQ) 10-3.5-12 MG-GM -GM/160ML SOLN Take 320 mLs by mouth as directed. 320 mL 0   traZODone (DESYREL) 50 MG tablet Take 50 mg by mouth at bedtime as needed for sleep. (Patient not taking: Reported on 09/06/2021)     Vitamin D, Ergocalciferol, (DRISDOL) 1.25 MG (50000 UNIT) CAPS capsule Take 50,000 Units by mouth every Wednesday. (Patient not taking: Reported on 09/06/2021)     zolpidem (AMBIEN) 10 MG tablet Take 10 mg by mouth  at bedtime as needed.     No current facility-administered medications for this visit.    Patient confirms/reports the following allergies:  Allergies  Allergen Reactions   Hydrocodone-Acetaminophen Nausea And Vomiting   Prednisone Other (See Comments)    Insomnia, violence Violent, insomnia Insomnia, violence   Amitriptyline Other (See Comments)    Tremors  Tremors     No orders of the defined types were placed in this  encounter.   AUTHORIZATION INFORMATION Primary Insurance: 1D#: Group #:  Secondary Insurance: 1D#: Group #:  SCHEDULE INFORMATION: Date:  Time: Location:

## 2021-10-05 ENCOUNTER — Other Ambulatory Visit: Payer: Self-pay

## 2021-10-08 ENCOUNTER — Other Ambulatory Visit: Payer: Self-pay

## 2021-10-08 MED ORDER — NA SULFATE-K SULFATE-MG SULF 17.5-3.13-1.6 GM/177ML PO SOLN
354.0000 mL | Freq: Once | ORAL | 0 refills | Status: AC
  Filled 2021-10-08: qty 354, fill #0
  Filled 2021-11-27: qty 354, 1d supply, fill #0

## 2021-10-09 ENCOUNTER — Other Ambulatory Visit: Payer: Self-pay

## 2021-10-09 ENCOUNTER — Ambulatory Visit: Payer: No Typology Code available for payment source | Admitting: Physical Therapy

## 2021-10-09 DIAGNOSIS — R278 Other lack of coordination: Secondary | ICD-10-CM | POA: Diagnosis not present

## 2021-10-09 DIAGNOSIS — R2689 Other abnormalities of gait and mobility: Secondary | ICD-10-CM

## 2021-10-09 DIAGNOSIS — M533 Sacrococcygeal disorders, not elsewhere classified: Secondary | ICD-10-CM

## 2021-10-09 NOTE — Therapy (Signed)
OUTPATIENT PHYSICAL THERAPY Treatment   Patient Name: Jaime Fletcher MRN: [id number] DOB:25-Nov-1970, 51 y.o., female Today's Date: 10/10/2021   PT End of Session - 10/09/21 1553     Visit Number 6    Number of Visits 10    Date for PT Re-Evaluation 11/15/21    PT Start Time 1548    PT Stop Time 39    PT Time Calculation (min) 42 min    Activity Tolerance Patient tolerated treatment well    Behavior During Therapy WFL for tasks assessed/performed             Past Medical History:  Diagnosis Date   Abnormal Pap smear of cervix    dysplasia   ADD (attention deficit disorder)    B12 deficiency    Fibroid    GERD (gastroesophageal reflux disease)    Migraine headache    Pneumonia    PTSD (post-traumatic stress disorder)    PTSD (post-traumatic stress disorder)    Past Surgical History:  Procedure Laterality Date   ABDOMINAL HYSTERECTOMY  01/31/2020   APPENDECTOMY     AUGMENTATION MAMMAPLASTY Bilateral 2005   cold knife bx     DILATION AND CURETTAGE OF UTERUS     LAPAROSCOPIC VAGINAL HYSTERECTOMY WITH SALPINGECTOMY Bilateral 01/31/2020   Procedure: LAPAROSCOPIC ASSISTED VAGINAL HYSTERECTOMY WITH BILATERAL SALPINGECTOMY IUD REMOVAL;  Surgeon: Louretta Shorten, MD;  Location: [facility];  Service: Gynecology;  Laterality: Bilateral;   SPINE SURGERY     C3-4 fusion   TONSILLECTOMY     Patient Active Problem List   Diagnosis Date Noted   S/P laparoscopic assisted vaginal hysterectomy (LAVH) 01/31/2020   Decreased libido 07/03/2018   Cervical stenosis (uterine cervix) 07/03/2018   IUD threads lost 07/03/2018   Breakthrough bleeding associated with intrauterine device (IUD) 07/03/2018   Left ovarian cyst 07/03/2018   Submucous leiomyoma of uterus 12/17/2017   Paroxysmal tachycardia ([place]) 09/09/2017   Chest pain 09/09/2017   Insomnia 09/09/2017   Depression 08/05/2017   Abnormal Pap smear of cervix 11/20/2016   Anxiety 11/20/2016   Fibromyalgia  11/20/2016   Encounter for insertion of intrauterine contraceptive device (IUD) 11/20/2016   B12 deficiency 06/19/2015   Vitamin D deficiency, unspecified 06/19/2015   Vitamin D deficiency 06/19/2015   Myopic astigmatism of both eyes 08/10/2014   Palpitations 05/19/2013   Attention deficit disorder 08/17/2010   Dysthymic disorder 08/17/2010   Obsessive-compulsive disorder 08/17/2010   Posttraumatic stress disorder 08/17/2010    PCP: Ellison Hughs MD   REFERRING PROVIDER: Louretta Shorten MD   REFERRING DIAG: N39.3 (ICD-10-CM) - Stress incontinence (female) (female)  Rationale for Evaluation and Treatment Rehabilitation  THERAPY DIAG:  Sacrococcygeal disorders, not elsewhere classified  Other abnormalities of gait and mobility  Other lack of coordination  ONSET DATE:  SUI started 2 years ago when she had fibroid the soft ball size which prompted the hysterectomy   SUBJECTIVE:  SUBJECTIVE STATEMENT: Pt has had a stressful week. Pt is not aware of her pelvic floor moving with the deep core exercises.   PERTINENT HISTORY:  she had fibroid the soft ball size which prompted the hysterectomy.  Hx of appendectomy. C3-4 fusion.   PAIN:  Are you having pain? no   PRECAUTIONS: None  WEIGHT BEARING RESTRICTIONS No  FALLS:  Has patient fallen in last 6 months? No  LIVING ENVIRONMENT: Lives with: lives with their family Lives in: House/apartment Stairs: Yes: External: 3 steps; on right going up   OCCUPATION: administrative services   PLOF: Independent  PATIENT GOALS   Get ahead of problems, eliminate leakage altogether, return to gym    OBJECTIVE:    [place] - 10/09/21 1705       Coordination   Coordination and Movement Description proper deep core but pt was not able to sense  pelvic floor lengthening                Pelvic Floor Special Questions - 10/09/21 1704     External Palpation tightness at ischiocavernosus/bulbospongiousus junction B              [facility] Adult PT Treatment/Exercise - 10/10/21 6063       Therapeutic Activites    Other Therapeutic Activities relaxation practices to relax pelvic floor      Neuro Re-ed    Neuro Re-ed Details  tactile cues for lengthen of pelvic floor      Moist Heat Therapy   Number Minutes Moist Heat 5 Minutes    Moist Heat Location --   perineum in supported butterfly pose, guided relaxation     Manual Therapy   Manual therapy comments STM/MWM at anterior mm of pelvic floor external technique               HOME EXERCISE PROGRAM: See pt instruction section     ASSESSMENT:  CLINICAL IMPRESSION: External manual Tx decreased anterior pelvic floor mm which improved with lengthening of pelvic floor. Pt gain awareness of movement of pelvic floor with deep core coordination given the improved mobility of low abdominal fascia achieved through the past sessions. Plan to focus on more guided relaxation practice per pt's interest to decrease stress which will help with minimize pelvic overactivity.  Pt continues to benefit from skilled PT.    OBJECTIVE IMPAIRMENTS decreased coordination, decreased endurance, decreased mobility, decreased ROM, decreased strength, decreased safety awareness, hypomobility, increased fascial restrictions, increased muscle spasms, improper body mechanics, and postural dysfunction.   ACTIVITY LIMITATIONS continence, toileting, and locomotion level  PARTICIPATION LIMITATIONS: interpersonal relationship, community activity, and occupation  PERSONAL FACTORS  Hx of fall onto her tailbone after falling ice in 2000. Vaginal deliveries for twins and one more child with tears, hysterectomy, appendectomy are also affecting patient's functional outcome.   REHAB POTENTIAL:  Good  CLINICAL DECISION MAKING: Evolving/moderate complexity  EVALUATION COMPLEXITY: Moderate   PATIENT EDUCATION:   Education details: Showed pt anatomy images. Explained muscles attachments/ connection, physiology of deep core system/ spinal- thoracic-pelvis-lower kinetic chain as they relate to pt's presentation, Sx, and past Hx. Explained what and how these areas of deficits need to be restored to balance and function   See Therapeutic activity / Neuromuscular education section  Person educated: Patient Education method: Explanation, Demonstration, Tactile cues, Verbal cues, and Handouts Education comprehension: verbalized understanding, returned demonstration, verbal cues required, tactile cues required, and needs further education   PLAN: PT FREQUENCY: 1x/week  PT DURATION: 10  weeks  PLANNED INTERVENTIONS: Therapeutic exercises, Therapeutic activity, Neuromuscular re-education, Balance training, Gait training, Patient/Family education, Self Care, Joint mobilization, Spinal mobilization, Moist heat, scar mobilization, and Taping.  PLAN FOR NEXT SESSION:  See clinical impression for plan     GOALS: Goals reviewed with patient? Yes  SHORT TERM GOALS: Target date: 11/07/2021  Pt will demo IND with HEP Baseline: Not IND   Goal status: INITIAL   LONG TERM GOALS: Target date: 12/19/2021  Pt will demo proper deep core coordination without chest breathing and optimal excursion of diaphragm/pelvic floor in order to promote spinal stability and pelvic floor function   Baseline: poor coordination Goal status: INITIAL  2.  Pt will demo > 5 pt change on PFDI  and urinary problem FOTO to improve QOL and functional mobility   Baseline:  25 pts, 51 pts  Goal status: INITIAL  3.  Pt will report changing less pads from 1 per day to 0 pads in order to enjoy her community activities  Baseline: 1 pad Goal status: INITIAL  4.  Pt will demo medially aligned coccyx, stronger hip  abduction mm B in order to progress to pelvic floor HEP/ deep core HEP  Baseline: coccyx deviated to L , hip abd 3+/5 B Goal status: INITIAL  5.  Pt will demo decreased abdominal scar restrictions in order to promote more facial mobility for deep core coordination  Baseline: restricted  Goal status: INITIAL  6.  Pt will demo proper body mechanics in against gravity tasks and ADLs, work tasks, and co-activation of deep core system with gym activities to minimize straining pelvic floor and in continence  Baseline: limited education  Goal status: INITIAL   Jerl Mina, PT 10/10/2021, 8:54 AM

## 2021-10-09 NOTE — Patient Instructions (Signed)
Stretch for pelvic floor   V- slides  "v heels slide away and then back toward buttocks and then rock knee to slight ,  slide heel along at 11 o clock away from buttocks   10 reps   __  Happy Baby   ___  Burna Mortimer

## 2021-10-10 ENCOUNTER — Encounter: Payer: No Typology Code available for payment source | Admitting: Physical Therapy

## 2021-10-16 ENCOUNTER — Other Ambulatory Visit: Payer: Self-pay

## 2021-10-23 ENCOUNTER — Ambulatory Visit: Payer: No Typology Code available for payment source | Admitting: Physical Therapy

## 2021-10-31 ENCOUNTER — Other Ambulatory Visit: Payer: Self-pay

## 2021-10-31 ENCOUNTER — Ambulatory Visit: Payer: No Typology Code available for payment source | Admitting: Physical Therapy

## 2021-10-31 MED ORDER — MELOXICAM 15 MG PO TABS
ORAL_TABLET | ORAL | 1 refills | Status: DC
Start: 2021-10-31 — End: 2022-02-15
  Filled 2021-10-31: qty 30, 30d supply, fill #0

## 2021-11-01 ENCOUNTER — Other Ambulatory Visit: Payer: Self-pay

## 2021-11-02 ENCOUNTER — Other Ambulatory Visit: Payer: Self-pay

## 2021-11-06 ENCOUNTER — Other Ambulatory Visit: Payer: Self-pay

## 2021-11-07 ENCOUNTER — Other Ambulatory Visit: Payer: Self-pay

## 2021-11-27 ENCOUNTER — Other Ambulatory Visit: Payer: Self-pay

## 2021-11-29 ENCOUNTER — Ambulatory Visit
Payer: No Typology Code available for payment source | Attending: Obstetrics and Gynecology | Admitting: Physical Therapy

## 2021-11-29 DIAGNOSIS — M533 Sacrococcygeal disorders, not elsewhere classified: Secondary | ICD-10-CM | POA: Diagnosis not present

## 2021-11-29 DIAGNOSIS — R278 Other lack of coordination: Secondary | ICD-10-CM | POA: Diagnosis present

## 2021-11-29 DIAGNOSIS — R2689 Other abnormalities of gait and mobility: Secondary | ICD-10-CM | POA: Diagnosis present

## 2021-11-29 NOTE — Therapy (Signed)
OUTPATIENT PHYSICAL THERAPY Treatment / Recert    Patient Name: Jaime Fletcher MRN: [id number] DOB:Apr 27, 1970, 51 y.o., female Today's Date: 11/29/2021   PT End of Session - 11/29/21 1419     Visit Number 7    Number of Visits 10    Date for PT Re-Evaluation 02/07/22    PT Start Time 4128    PT Stop Time 1500    PT Time Calculation (min) 43 min    Activity Tolerance Patient tolerated treatment well    Behavior During Therapy WFL for tasks assessed/performed             Past Medical History:  Diagnosis Date   Abnormal Pap smear of cervix    dysplasia   ADD (attention deficit disorder)    B12 deficiency    Fibroid    GERD (gastroesophageal reflux disease)    Migraine headache    Pneumonia    PTSD (post-traumatic stress disorder)    PTSD (post-traumatic stress disorder)    Past Surgical History:  Procedure Laterality Date   ABDOMINAL HYSTERECTOMY  01/31/2020   APPENDECTOMY     AUGMENTATION MAMMAPLASTY Bilateral 2005   cold knife bx     DILATION AND CURETTAGE OF UTERUS     LAPAROSCOPIC VAGINAL HYSTERECTOMY WITH SALPINGECTOMY Bilateral 01/31/2020   Procedure: LAPAROSCOPIC ASSISTED VAGINAL HYSTERECTOMY WITH BILATERAL SALPINGECTOMY IUD REMOVAL;  Surgeon: Louretta Shorten, MD;  Location: [place];  Service: Gynecology;  Laterality: Bilateral;   SPINE SURGERY     C3-4 fusion   TONSILLECTOMY     Patient Active Problem List   Diagnosis Date Noted   S/P laparoscopic assisted vaginal hysterectomy (LAVH) 01/31/2020   Decreased libido 07/03/2018   Cervical stenosis (uterine cervix) 07/03/2018   IUD threads lost 07/03/2018   Breakthrough bleeding associated with intrauterine device (IUD) 07/03/2018   Left ovarian cyst 07/03/2018   Submucous leiomyoma of uterus 12/17/2017   Paroxysmal tachycardia ([place]) 09/09/2017   Chest pain 09/09/2017   Insomnia 09/09/2017   Depression 08/05/2017   Abnormal Pap smear of cervix 11/20/2016   Anxiety 11/20/2016    Fibromyalgia 11/20/2016   Encounter for insertion of intrauterine contraceptive device (IUD) 11/20/2016   B12 deficiency 06/19/2015   Vitamin D deficiency, unspecified 06/19/2015   Vitamin D deficiency 06/19/2015   Myopic astigmatism of both eyes 08/10/2014   Palpitations 05/19/2013   Attention deficit disorder 08/17/2010   Dysthymic disorder 08/17/2010   Obsessive-compulsive disorder 08/17/2010   Posttraumatic stress disorder 08/17/2010    PCP: Ellison Hughs MD   REFERRING PROVIDER: Louretta Shorten MD   REFERRING DIAG: N39.3 (ICD-10-CM) - Stress incontinence (female) (female)  Rationale for Evaluation and Treatment Rehabilitation  THERAPY DIAG:  Sacrococcygeal disorders, not elsewhere classified  Other abnormalities of gait and mobility  Other lack of coordination  ONSET DATE:  SUI started 2 years ago when she had fibroid the soft ball size which prompted the hysterectomy   SUBJECTIVE:  SUBJECTIVE STATEMENT: Pt reported she noticed she laughed with friends and she had leakage but it was less than before.   PERTINENT HISTORY:  she had fibroid the soft ball size which prompted the hysterectomy.  Hx of appendectomy. C3-4 fusion.   PAIN:  Are you having pain? no   PRECAUTIONS: None  WEIGHT BEARING RESTRICTIONS No  FALLS:  Has patient fallen in last 6 months? No  LIVING ENVIRONMENT: Lives with: lives with their family Lives in: House/apartment Stairs: Yes: External: 3 steps; on right going up   OCCUPATION: administrative services   PLOF: Independent  PATIENT GOALS   Get ahead of problems, eliminate leakage altogether, return to gym    OBJECTIVE:     [facility] PT Assessment - 11/30/21 1230       Coordination   Coordination and Movement Description breathholding, sucking in belly,  limited diaphramgatic exercision,  deep core coordination with ab overuse/ chest breathing             Pelvic Floor Special Questions - 11/30/21 1229     Pelvic Floor Internal Exam pt consented verbally, no contraindications    Exam Type Vaginal    Palpation tightness at 6-8  o'clock  deep layers             [facility] Adult PT Treatment/Exercise - 11/30/21 1231       Therapeutic Activites    Other Therapeutic Activities reassessed goals, discussed new goals for return to fitness with less risk for leakage      Neuro Re-ed    Neuro Re-ed Details  cued for proper coordination of deep core , educated on deep core exericse compliance , proper sequence of pelvic floor coordination/ activation                HOME EXERCISE PROGRAM: See pt instruction section     ASSESSMENT:  CLINICAL IMPRESSION:  Pt has achieved 3/7 goals and is progressing well towards remaining goals. Pt wears a pad just in case but for 5 out of 7 days, she has no leakage. She has 2 days with leakage per week. This is an improvement. Pt 's posture alignment, abdominal scars restrictions, and tailbone alignment have been corrected. But deep core coordination is improving. Pt still requires cues for less abdominal mm overuse which must be achieved to optimize lengthening of pelvic floor before advancing to kegel strengthening. Pt would like to return to weight training but advised pt to continue to hold off until pt remains compliant with deep core exercises and demonstrates increased L hip extension and abduction on L. Plan to add resistance band strengthening as preliminary steps towards weight training. Anticipate pt will achieve remaining goals.   Pt continues to benefit from skilled PT.    OBJECTIVE IMPAIRMENTS decreased coordination, decreased endurance, decreased mobility, decreased ROM, decreased strength, decreased safety awareness, hypomobility, increased fascial restrictions, increased muscle spasms,  improper body mechanics, and postural dysfunction.   ACTIVITY LIMITATIONS continence, toileting, and locomotion level  PARTICIPATION LIMITATIONS: interpersonal relationship, community activity, and occupation  PERSONAL FACTORS  Hx of fall onto her tailbone after falling ice in 2000. Vaginal deliveries for twins and one more child with tears, hysterectomy, appendectomy are also affecting patient's functional outcome.   REHAB POTENTIAL: Good  CLINICAL DECISION MAKING: Evolving/moderate complexity  EVALUATION COMPLEXITY: Moderate   PATIENT EDUCATION:   Education details: Showed pt anatomy images. Explained muscles attachments/ connection, physiology of deep core system/ spinal- thoracic-pelvis-lower kinetic chain as they relate to pt's presentation, Sx,  and past Hx. Explained what and how these areas of deficits need to be restored to balance and function   See Therapeutic activity / Neuromuscular education section  Person educated: Patient Education method: Explanation, Demonstration, Tactile cues, Verbal cues, and Handouts Education comprehension: verbalized understanding, returned demonstration, verbal cues required, tactile cues required, and needs further education   PLAN: PT FREQUENCY: 1x/week  PT DURATION: 10 weeks  PLANNED INTERVENTIONS: Therapeutic exercises, Therapeutic activity, Neuromuscular re-education, Balance training, Gait training, Patient/Family education, Self Care, Joint mobilization, Spinal mobilization, Moist heat, scar mobilization, and Taping.  PLAN FOR NEXT SESSION:  See clinical impression for plan     GOALS: Goals reviewed with patient? Yes  SHORT TERM GOALS: Target date: 12/27/2021  Pt will demo IND with HEP Baseline: Not IND   Goal status: MET    LONG TERM GOALS: Target date: 02/07/2022  Pt will demo proper deep core coordination without chest breathing and optimal excursion of diaphragm/pelvic floor in order to promote spinal stability and  pelvic floor function   Baseline: poor coordination Goal status: MET   2.  Pt will demo > 5 pt change on PFDI  and urinary problem FOTO to improve QOL and functional mobility   Baseline:  25 pts, 51 pts   ( 11/29/21:  17, 9 pts)  Goal status: Achieved  3.  Pt will report changing less pads from 1 per day to 0 pads in order to enjoy her community activities  Baseline: 1 pad Goal status:   Pt wears a pad just in case but for 5 out of 7 days, she has no leakage. She has 2 days with leakage per week. ( 10/19/ 23)  On going   4.  Pt will demo medially aligned coccyx, stronger hip abduction mm B in order to progress to pelvic floor HEP/ deep core HEP  Baseline: coccyx deviated to L , hip abd 3+/5 B  ( L 3/5, R 5/5)  Goal status: Partially met   5.  Pt will demo decreased abdominal scar restrictions in order to promote more facial mobility for deep core coordination  Baseline: restricted  Goal status:MET   6.  Pt will demo proper body mechanics in against gravity tasks and ADLs, work tasks, and co-activation of deep core system with gym activities to minimize straining pelvic floor and in continence  Baseline: limited education  Goal status:On going   7.  Pt will demo increased hip ext L 3+/5 to > 4/5 in order to progress to fitness with less risk for pelvic floor dysfunctions Baseline: 3+/5  Goal Status: Initial    Jerl Mina, PT 11/29/2021, 2:25 PM

## 2021-11-30 NOTE — Patient Instructions (Signed)
Deep core level 1-2   

## 2021-12-03 ENCOUNTER — Other Ambulatory Visit: Payer: Self-pay

## 2021-12-03 MED ORDER — PHENTERMINE HCL 37.5 MG PO TABS
ORAL_TABLET | ORAL | 2 refills | Status: DC
  Filled 2021-12-03: qty 30, 30d supply, fill #0
  Filled 2022-01-09: qty 30, 30d supply, fill #1
  Filled 2022-02-22: qty 30, 30d supply, fill #2

## 2021-12-04 ENCOUNTER — Other Ambulatory Visit: Payer: Self-pay

## 2021-12-05 ENCOUNTER — Other Ambulatory Visit: Payer: Self-pay

## 2021-12-07 ENCOUNTER — Ambulatory Visit: Payer: No Typology Code available for payment source | Admitting: Physical Therapy

## 2021-12-07 DIAGNOSIS — R2689 Other abnormalities of gait and mobility: Secondary | ICD-10-CM

## 2021-12-07 DIAGNOSIS — M533 Sacrococcygeal disorders, not elsewhere classified: Secondary | ICD-10-CM

## 2021-12-07 DIAGNOSIS — R278 Other lack of coordination: Secondary | ICD-10-CM

## 2021-12-07 NOTE — Therapy (Signed)
OUTPATIENT PHYSICAL THERAPY Treatment    Patient Name: Jaime Fletcher MRN: [id number] DOB:11/11/1970, 51 y.o., female Today's Date: 12/07/2021   PT End of Session - 12/07/21 0806     Visit Number 8    Number of Visits 10    Date for PT Re-Evaluation 02/07/22    PT Start Time 0800    PT Stop Time 0845    PT Time Calculation (min) 45 min    Activity Tolerance Patient tolerated treatment well    Behavior During Therapy [facility] for tasks assessed/performed             Past Medical History:  Diagnosis Date   Abnormal Pap smear of cervix    dysplasia   ADD (attention deficit disorder)    B12 deficiency    Fibroid    GERD (gastroesophageal reflux disease)    Migraine headache    Pneumonia    PTSD (post-traumatic stress disorder)    PTSD (post-traumatic stress disorder)    Past Surgical History:  Procedure Laterality Date   ABDOMINAL HYSTERECTOMY  01/31/2020   APPENDECTOMY     AUGMENTATION MAMMAPLASTY Bilateral 2005   cold knife bx     DILATION AND CURETTAGE OF UTERUS     LAPAROSCOPIC VAGINAL HYSTERECTOMY WITH SALPINGECTOMY Bilateral 01/31/2020   Procedure: LAPAROSCOPIC ASSISTED VAGINAL HYSTERECTOMY WITH BILATERAL SALPINGECTOMY IUD REMOVAL;  Surgeon: Louretta Shorten, MD;  Location: [place];  Service: Gynecology;  Laterality: Bilateral;   SPINE SURGERY     C3-4 fusion   TONSILLECTOMY     Patient Active Problem List   Diagnosis Date Noted   S/P laparoscopic assisted vaginal hysterectomy (LAVH) 01/31/2020   Decreased libido 07/03/2018   Cervical stenosis (uterine cervix) 07/03/2018   IUD threads lost 07/03/2018   Breakthrough bleeding associated with intrauterine device (IUD) 07/03/2018   Left ovarian cyst 07/03/2018   Submucous leiomyoma of uterus 12/17/2017   Paroxysmal tachycardia ([place]) 09/09/2017   Chest pain 09/09/2017   Insomnia 09/09/2017   Depression 08/05/2017   Abnormal Pap smear of cervix 11/20/2016   Anxiety 11/20/2016   Fibromyalgia  11/20/2016   Encounter for insertion of intrauterine contraceptive device (IUD) 11/20/2016   B12 deficiency 06/19/2015   Vitamin D deficiency, unspecified 06/19/2015   Vitamin D deficiency 06/19/2015   Myopic astigmatism of both eyes 08/10/2014   Palpitations 05/19/2013   Attention deficit disorder 08/17/2010   Dysthymic disorder 08/17/2010   Obsessive-compulsive disorder 08/17/2010   Posttraumatic stress disorder 08/17/2010    PCP: Ellison Hughs MD   REFERRING PROVIDER: Louretta Shorten MD   REFERRING DIAG: N39.3 (ICD-10-CM) - Stress incontinence (female) (female)  Rationale for Evaluation and Treatment Rehabilitation  THERAPY DIAG:  Sacrococcygeal disorders, not elsewhere classified  Other abnormalities of gait and mobility  Other lack of coordination  ONSET DATE:  SUI started 2 years ago when she had fibroid the soft ball size which prompted the hysterectomy   SUBJECTIVE:  SUBJECTIVE STATEMENT: Pt reported she noticed she leaked when reaching for a mixing bowl   PERTINENT HISTORY:  she had fibroid the soft ball size which prompted the hysterectomy.  Hx of appendectomy. C3-4 fusion.   PAIN:  Are you having pain? no   PRECAUTIONS: None  WEIGHT BEARING RESTRICTIONS No  FALLS:  Has patient fallen in last 6 months? No  LIVING ENVIRONMENT: Lives with: lives with their family Lives in: House/apartment Stairs: Yes: External: 3 steps; on right going up   OCCUPATION: administrative services   PLOF: Independent  PATIENT GOALS   Get ahead of problems, eliminate leakage altogether, return to gym    OBJECTIVE:    [place] - 12/07/21 1103       Coordination   Coordination and Movement Description less chest breathing, shortened               Pelvic Floor Special  Questions - 12/07/21 1156     Pelvic Floor Internal Exam pt consented verbally, no contraindications    Exam Type Vaginal    Palpation tightness at 5 o'clock  L ATLA , slightly lowered urethra behind pubic symphysis              [facility] Adult PT Treatment/Exercise - 12/07/21 1154       Therapeutic Activites    Other Therapeutic Activities modified with pilow udner pelvis for more propioception training for pelvic floor movement in deep core HEP, explained sexual function of pelvic floor, relaxation practices for relaxation of pelvic floor/ minimizing tightening abdominal mm      Neuro Re-ed    Neuro Re-ed Details  cued for more sequential contraction of pelvic floor with less ab overuse in deep core      Moist Heat Therapy   Number Minutes Moist Heat 5 Minutes    Moist Heat Location --   perineum  ( during review of deep core exercise)     Manual Therapy   Internal Pelvic Floor STM/MWM at 5 o-clock, ATLA L, lateral B of urethra  to faciliate more upward mobility to pelvic floor mm                 HOME EXERCISE PROGRAM: See pt instruction section     ASSESSMENT:  CLINICAL IMPRESSION:  Pt showed slightly lowered urethra  and L posterior mm tightness with internal pelvic floor assessment. Pt required more cues for less abdominal mm overuse. Pt demo'd improved propipceiton of pelvic floor upward movement with pillow propped under hips. Continue with more pelvic floor awareness/ propioception training. Plan to add resistance band exercises at next session as pt is eager to get back to weight training. Pt continues to benefit from skilled PT.    OBJECTIVE IMPAIRMENTS decreased coordination, decreased endurance, decreased mobility, decreased ROM, decreased strength, decreased safety awareness, hypomobility, increased fascial restrictions, increased muscle spasms, improper body mechanics, and postural dysfunction.   ACTIVITY LIMITATIONS continence, toileting, and locomotion  level  PARTICIPATION LIMITATIONS: interpersonal relationship, community activity, and occupation  PERSONAL FACTORS  Hx of fall onto her tailbone after falling ice in 2000. Vaginal deliveries for twins and one more child with tears, hysterectomy, appendectomy are also affecting patient's functional outcome.   REHAB POTENTIAL: Good  CLINICAL DECISION MAKING: Evolving/moderate complexity  EVALUATION COMPLEXITY: Moderate   PATIENT EDUCATION:   Education details: Showed pt anatomy images. Explained muscles attachments/ connection, physiology of deep core system/ spinal- thoracic-pelvis-lower kinetic chain as they relate to pt's presentation, Sx, and past Hx. Explained  what and how these areas of deficits need to be restored to balance and function   See Therapeutic activity / Neuromuscular education section  Person educated: Patient Education method: Explanation, Demonstration, Tactile cues, Verbal cues, and Handouts Education comprehension: verbalized understanding, returned demonstration, verbal cues required, tactile cues required, and needs further education   PLAN: PT FREQUENCY: 1x/week  PT DURATION: 10 weeks  PLANNED INTERVENTIONS: Therapeutic exercises, Therapeutic activity, Neuromuscular re-education, Balance training, Gait training, Patient/Family education, Self Care, Joint mobilization, Spinal mobilization, Moist heat, scar mobilization, and Taping.  PLAN FOR NEXT SESSION:  See clinical impression for plan     GOALS: Goals reviewed with patient? Yes  SHORT TERM GOALS: Target date: 01/04/2022  Pt will demo IND with HEP Baseline: Not IND   Goal status: MET    LONG TERM GOALS: Target date: 02/15/2022  Pt will demo proper deep core coordination without chest breathing and optimal excursion of diaphragm/pelvic floor in order to promote spinal stability and pelvic floor function   Baseline: poor coordination Goal status: MET   2.  Pt will demo > 5 pt change on PFDI   and urinary problem FOTO to improve QOL and functional mobility   Baseline:  25 pts, 51 pts   ( 11/29/21:  17, 9 pts)  Goal status: Achieved  3.  Pt will report changing less pads from 1 per day to 0 pads in order to enjoy her community activities  Baseline: 1 pad Goal status:   Pt wears a pad just in case but for 5 out of 7 days, she has no leakage. She has 2 days with leakage per week. ( 10/19/ 23)  On going   4.  Pt will demo medially aligned coccyx, stronger hip abduction mm B in order to progress to pelvic floor HEP/ deep core HEP  Baseline: coccyx deviated to L , hip abd 3+/5 B  ( L 3/5, R 5/5)  Goal status: Partially met   5.  Pt will demo decreased abdominal scar restrictions in order to promote more facial mobility for deep core coordination  Baseline: restricted  Goal status:MET   6.  Pt will demo proper body mechanics in against gravity tasks and ADLs, work tasks, and co-activation of deep core system with gym activities to minimize straining pelvic floor and in continence  Baseline: limited education  Goal status:On going   7.  Pt will demo increased hip ext L 3+/5 to > 4/5 in order to progress to fitness with less risk for pelvic floor dysfunctions Baseline: 3+/5  Goal Status: Initial    Jerl Mina, PT 12/07/2021, 12:27 PM

## 2021-12-07 NOTE — Patient Instructions (Signed)
Deep core level 1 and 2 with hips propped on pillow

## 2021-12-10 ENCOUNTER — Other Ambulatory Visit: Payer: Self-pay

## 2021-12-10 MED ORDER — CELECOXIB 200 MG PO CAPS
200.0000 mg | ORAL_CAPSULE | Freq: Every day | ORAL | 0 refills | Status: DC
  Filled 2021-12-10: qty 30, 30d supply, fill #0

## 2021-12-13 ENCOUNTER — Other Ambulatory Visit: Payer: Self-pay | Admitting: Surgery

## 2021-12-13 ENCOUNTER — Ambulatory Visit: Payer: No Typology Code available for payment source | Admitting: Physical Therapy

## 2021-12-13 DIAGNOSIS — N6002 Solitary cyst of left breast: Secondary | ICD-10-CM

## 2021-12-18 ENCOUNTER — Other Ambulatory Visit: Payer: Self-pay

## 2021-12-18 MED ORDER — PANTOPRAZOLE SODIUM 40 MG PO TBEC
DELAYED_RELEASE_TABLET | ORAL | 0 refills | Status: AC
  Filled 2021-12-18: qty 90, 90d supply, fill #0

## 2021-12-19 ENCOUNTER — Ambulatory Visit
Payer: No Typology Code available for payment source | Attending: Obstetrics and Gynecology | Admitting: Physical Therapy

## 2021-12-19 ENCOUNTER — Other Ambulatory Visit: Payer: Self-pay

## 2021-12-19 DIAGNOSIS — M533 Sacrococcygeal disorders, not elsewhere classified: Secondary | ICD-10-CM | POA: Diagnosis not present

## 2021-12-19 DIAGNOSIS — R2689 Other abnormalities of gait and mobility: Secondary | ICD-10-CM | POA: Diagnosis present

## 2021-12-19 DIAGNOSIS — R278 Other lack of coordination: Secondary | ICD-10-CM | POA: Diagnosis present

## 2021-12-19 DIAGNOSIS — M542 Cervicalgia: Secondary | ICD-10-CM | POA: Diagnosis present

## 2021-12-19 NOTE — Patient Instructions (Signed)
Lying on back, knees bent    band under ballmounds  while laying on back w/ knees bent  "W" exercise  20 reps   Band is placed under feet, knees bent, feet are hip width apart Hold band with thumbs point out, keep upper arm and elbow touching the bed the whole time  - inhale and then exhale pull bands by bending elbows hands move in a "w"  (feel shoulder blades squeezing)    _____________________________ Opposite arm and leg extension   20reps  On each side  Opposite leg(R)  extends while arm (L) moves overhead (elbow straight)  Red band under L heel, inhale, slide R leg slide while L arm pulls band overhead (elbow extended)     __________________________

## 2021-12-19 NOTE — Therapy (Signed)
OUTPATIENT PHYSICAL THERAPY Treatment    Patient Name: Jaime Fletcher MRN: [id number] DOB:1970/04/30, 51 y.o., female Today's Date: 12/19/2021   PT End of Session - 12/19/21 1507     Visit Number 9    Number of Visits 10    Date for PT Re-Evaluation 02/07/22    PT Start Time 1505    PT Stop Time 3794    PT Time Calculation (min) 40 min    Activity Tolerance Patient tolerated treatment well    Behavior During Therapy WFL for tasks assessed/performed             Past Medical History:  Diagnosis Date   Abnormal Pap smear of cervix    dysplasia   ADD (attention deficit disorder)    B12 deficiency    Fibroid    GERD (gastroesophageal reflux disease)    Migraine headache    Pneumonia    PTSD (post-traumatic stress disorder)    PTSD (post-traumatic stress disorder)    Past Surgical History:  Procedure Laterality Date   ABDOMINAL HYSTERECTOMY  01/31/2020   APPENDECTOMY     AUGMENTATION MAMMAPLASTY Bilateral 2005   cold knife bx     DILATION AND CURETTAGE OF UTERUS     LAPAROSCOPIC VAGINAL HYSTERECTOMY WITH SALPINGECTOMY Bilateral 01/31/2020   Procedure: LAPAROSCOPIC ASSISTED VAGINAL HYSTERECTOMY WITH BILATERAL SALPINGECTOMY IUD REMOVAL;  Surgeon: Louretta Shorten, MD;  Location: [place];  Service: Gynecology;  Laterality: Bilateral;   SPINE SURGERY     C3-4 fusion   TONSILLECTOMY     Patient Active Problem List   Diagnosis Date Noted   S/P laparoscopic assisted vaginal hysterectomy (LAVH) 01/31/2020   Decreased libido 07/03/2018   Cervical stenosis (uterine cervix) 07/03/2018   IUD threads lost 07/03/2018   Breakthrough bleeding associated with intrauterine device (IUD) 07/03/2018   Left ovarian cyst 07/03/2018   Submucous leiomyoma of uterus 12/17/2017   Paroxysmal tachycardia ([place]) 09/09/2017   Chest pain 09/09/2017   Insomnia 09/09/2017   Depression 08/05/2017   Abnormal Pap smear of cervix 11/20/2016   Anxiety 11/20/2016   Fibromyalgia  11/20/2016   Encounter for insertion of intrauterine contraceptive device (IUD) 11/20/2016   B12 deficiency 06/19/2015   Vitamin D deficiency, unspecified 06/19/2015   Vitamin D deficiency 06/19/2015   Myopic astigmatism of both eyes 08/10/2014   Palpitations 05/19/2013   Attention deficit disorder 08/17/2010   Dysthymic disorder 08/17/2010   Obsessive-compulsive disorder 08/17/2010   Posttraumatic stress disorder 08/17/2010    PCP: Ellison Hughs MD   REFERRING PROVIDER: Louretta Shorten MD   REFERRING DIAG: N39.3 (ICD-10-CM) - Stress incontinence (female) (female)  Rationale for Evaluation and Treatment Rehabilitation  THERAPY DIAG:  Sacrococcygeal disorders, not elsewhere classified  Other abnormalities of gait and mobility  Other lack of coordination  ONSET DATE:  SUI started 2 years ago when she had fibroid the soft ball size which prompted the hysterectomy   SUBJECTIVE:  SUBJECTIVE STATEMENT: Pt reported no issues after last session. Leakage is better. Pt has been trying to go without panty liners 2-3 days. Last week was   PERTINENT HISTORY:  she had fibroid the soft ball size which prompted the hysterectomy.  Hx of appendectomy. C3-4 fusion.   PAIN:  Are you having pain? no   PRECAUTIONS: None  WEIGHT BEARING RESTRICTIONS No  FALLS:  Has patient fallen in last 6 months? No  LIVING ENVIRONMENT: Lives with: lives with their family Lives in: House/apartment Stairs: Yes: External: 3 steps; on right going up   OCCUPATION: administrative services   PLOF: Independent  PATIENT GOALS   Get ahead of problems, eliminate leakage altogether, return to gym    OBJECTIVE:                 [place]: See pt instruction section     ASSESSMENT:  CLINICAL  IMPRESSION:  Pt showed slightly lowered urethra  and L posterior mm tightness with internal pelvic floor assessment. Pt required more cues for less abdominal mm overuse. Pt demo'd improved propipceiton of pelvic floor upward movement with pillow propped under hips. Continue with more pelvic floor awareness/ propioception training. Plan to add resistance band exercises at next session as pt is eager to get back to weight training. Pt continues to benefit from skilled PT.    OBJECTIVE IMPAIRMENTS decreased coordination, decreased endurance, decreased mobility, decreased ROM, decreased strength, decreased safety awareness, hypomobility, increased fascial restrictions, increased muscle spasms, improper body mechanics, and postural dysfunction.   ACTIVITY LIMITATIONS continence, toileting, and locomotion level  PARTICIPATION LIMITATIONS: interpersonal relationship, community activity, and occupation  PERSONAL FACTORS  Hx of fall onto her tailbone after falling ice in 2000. Vaginal deliveries for twins and one more child with tears, hysterectomy, appendectomy are also affecting patient's functional outcome.   REHAB POTENTIAL: Good  CLINICAL DECISION MAKING: Evolving/moderate complexity  EVALUATION COMPLEXITY: Moderate   PATIENT EDUCATION:   Education details: Showed pt anatomy images. Explained muscles attachments/ connection, physiology of deep core system/ spinal- thoracic-pelvis-lower kinetic chain as they relate to pt's presentation, Sx, and past Hx. Explained what and how these areas of deficits need to be restored to balance and function   See Therapeutic activity / Neuromuscular education section  Person educated: Patient Education method: Explanation, Demonstration, Tactile cues, Verbal cues, and Handouts Education comprehension: verbalized understanding, returned demonstration, verbal cues required, tactile cues required, and needs further education   PLAN: PT FREQUENCY:  1x/week  PT DURATION: 10 weeks  PLANNED INTERVENTIONS: Therapeutic exercises, Therapeutic activity, Neuromuscular re-education, Balance training, Gait training, Patient/Family education, Self Care, Joint mobilization, Spinal mobilization, Moist heat, scar mobilization, and Taping.  PLAN FOR NEXT SESSION:  See clinical impression for plan     GOALS: Goals reviewed with patient? Yes  SHORT TERM GOALS: Target date: 01/16/2022  Pt will demo IND with HEP Baseline: Not IND   Goal status: MET    LONG TERM GOALS: Target date: 02/27/2022  Pt will demo proper deep core coordination without chest breathing and optimal excursion of diaphragm/pelvic floor in order to promote spinal stability and pelvic floor function   Baseline: poor coordination Goal status: MET   2.  Pt will demo > 5 pt change on PFDI  and urinary problem FOTO to improve QOL and functional mobility   Baseline:  25 pts, 51 pts   ( 11/29/21:  17, 9 pts)  Goal status: Achieved  3.  Pt will report changing less pads from 1  per day to 0 pads in order to enjoy her community activities  Baseline: 1 pad Goal status:   Pt wears a pad just in case but for 5 out of 7 days, she has no leakage. She has 2 days with leakage per week. ( 10/19/ 23)  On going   4.  Pt will demo medially aligned coccyx, stronger hip abduction mm B in order to progress to pelvic floor HEP/ deep core HEP  Baseline: coccyx deviated to L , hip abd 3+/5 B  ( L 3/5, R 5/5)  Goal status: Partially met   5.  Pt will demo decreased abdominal scar restrictions in order to promote more facial mobility for deep core coordination  Baseline: restricted  Goal status:MET   6.  Pt will demo proper body mechanics in against gravity tasks and ADLs, work tasks, and co-activation of deep core system with gym activities to minimize straining pelvic floor and in continence  Baseline: limited education  Goal status:On going   7.  Pt will demo increased hip ext L 3+/5 to >  4/5 in order to progress to fitness with less risk for pelvic floor dysfunctions Baseline: 3+/5  Goal Status: Initial    Jerl Mina, PT 12/19/2021, 3:07 PM

## 2021-12-20 ENCOUNTER — Encounter: Payer: No Typology Code available for payment source | Admitting: Physical Therapy

## 2021-12-24 ENCOUNTER — Encounter: Payer: No Typology Code available for payment source | Admitting: Physical Therapy

## 2021-12-26 ENCOUNTER — Ambulatory Visit: Payer: No Typology Code available for payment source | Admitting: Physical Therapy

## 2021-12-26 DIAGNOSIS — R2689 Other abnormalities of gait and mobility: Secondary | ICD-10-CM

## 2021-12-26 DIAGNOSIS — M542 Cervicalgia: Secondary | ICD-10-CM

## 2021-12-26 DIAGNOSIS — M533 Sacrococcygeal disorders, not elsewhere classified: Secondary | ICD-10-CM

## 2021-12-26 DIAGNOSIS — R278 Other lack of coordination: Secondary | ICD-10-CM
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
## 2021-12-26 NOTE — Patient Instructions (Signed)
   At work:  _Doorway squat : bear stretch for shoulder blades _wall stretch Clock stretch with head turns :  stand perpendicular to the wall, L side to the wall Tilt head to wall  Place L palm at 7 o clock Chin tuck like you are looking into armpit Look at "[place] on giant map  Swivel head with chin tucked to look in upper corner of ceiling as if you are look at  [place] on giant map "   5 reps   Switch direction, R palm on wall at 5 o 'clock   Chin tuck like you are looking into armpit Look at "[place]  on giant map  Swivel head with chin tucked to look in upper corner of ceiling as if you are look at  [facility] on giant map "   5 reps    ___  Dolphin squats on wall   Fingers interlaced, elbows shoulder width apart, forearms in a triangle pressing against the wall Mini squat position   Inhale, exhale chin tucked, shoulders lengthen down from ears, as you rise up not locking knees, hairline brushes by thumbs ( "like dolphin snout / unicorn diving up our the water"  15 reps.  Make sure to not let the front ribs flare out, ribs over the pelvis aligned to not have a swayed back  Pressure through ballmounds to rise up, and dont lock the knees

## 2021-12-27 ENCOUNTER — Other Ambulatory Visit: Payer: Self-pay | Admitting: Surgery

## 2021-12-27 ENCOUNTER — Ambulatory Visit
Admission: RE | Admit: 2021-12-27 | Discharge: 2021-12-27 | Disposition: A | Discharge status: Home / Self Care | Payer: No Typology Code available for payment source | Source: Ambulatory Visit | Attending: Surgery | Admitting: Surgery

## 2021-12-27 ENCOUNTER — Other Ambulatory Visit: Payer: Self-pay

## 2021-12-27 DIAGNOSIS — N6002 Solitary cyst of left breast: Secondary | ICD-10-CM | POA: Diagnosis present

## 2021-12-28 ENCOUNTER — Other Ambulatory Visit: Payer: Self-pay | Admitting: Surgery

## 2021-12-28 DIAGNOSIS — N6002 Solitary cyst of left breast: Secondary | ICD-10-CM

## 2021-12-28 DIAGNOSIS — R928 Other abnormal and inconclusive findings on diagnostic imaging of breast: Secondary | ICD-10-CM

## 2022-01-01 ENCOUNTER — Other Ambulatory Visit: Payer: Self-pay

## 2022-01-01 ENCOUNTER — Telehealth: Payer: No Typology Code available for payment source | Admitting: Family Medicine

## 2022-01-01 DIAGNOSIS — R3989 Other symptoms and signs involving the genitourinary system: Secondary | ICD-10-CM | POA: Diagnosis not present

## 2022-01-01 MED ORDER — NITROFURANTOIN MONOHYD MACRO 100 MG PO CAPS
100.0000 mg | ORAL_CAPSULE | Freq: Two times a day (BID) | ORAL | 0 refills | Status: AC
  Filled 2022-01-01: qty 10, 5d supply, fill #0

## 2022-01-01 NOTE — Progress Notes (Signed)

## 2022-01-07 ENCOUNTER — Ambulatory Visit: Payer: No Typology Code available for payment source | Admitting: Anesthesiology

## 2022-01-07 ENCOUNTER — Other Ambulatory Visit: Payer: Self-pay

## 2022-01-07 ENCOUNTER — Ambulatory Visit
Admission: RE | Admit: 2022-01-07 | Discharge: 2022-01-07 | Disposition: A | Discharge status: Home / Self Care | Payer: No Typology Code available for payment source | Source: Ambulatory Visit | Attending: Gastroenterology | Admitting: Gastroenterology

## 2022-01-07 ENCOUNTER — Encounter: Payer: Self-pay | Admitting: Gastroenterology

## 2022-01-07 ENCOUNTER — Encounter
Admission: RE | Disposition: A | Discharge status: Home / Self Care | Payer: Self-pay | Source: Ambulatory Visit | Attending: Gastroenterology

## 2022-01-07 DIAGNOSIS — Z1211 Encounter for screening for malignant neoplasm of colon: Secondary | ICD-10-CM | POA: Diagnosis not present

## 2022-01-07 DIAGNOSIS — Z83711 Family history of hyperplastic colon polyps: Secondary | ICD-10-CM | POA: Diagnosis not present

## 2022-01-07 DIAGNOSIS — K219 Gastro-esophageal reflux disease without esophagitis: Secondary | ICD-10-CM | POA: Diagnosis not present

## 2022-01-07 HISTORY — PX: COLONOSCOPY WITH PROPOFOL: SHX5780

## 2022-01-07 SURGERY — COLONOSCOPY WITH PROPOFOL
Anesthesia: General

## 2022-01-07 MED ORDER — PROPOFOL 1000 MG/100ML IV EMUL
INTRAVENOUS | Status: AC
  Filled 2022-01-07: qty 100

## 2022-01-07 MED ORDER — PHENYLEPHRINE HCL (PRESSORS) 10 MG/ML IV SOLN
INTRAVENOUS | Status: DC | PRN
  Administered 2022-01-07: 160 ug via INTRAVENOUS

## 2022-01-07 MED ORDER — PROPOFOL 10 MG/ML IV BOLUS
INTRAVENOUS | Status: DC | PRN
  Administered 2022-01-07: 100 mg via INTRAVENOUS

## 2022-01-07 MED ORDER — DEXMEDETOMIDINE HCL IN NACL 200 MCG/50ML IV SOLN
INTRAVENOUS | Status: DC | PRN
  Administered 2022-01-07: 4 ug via INTRAVENOUS
  Administered 2022-01-07: 8 ug via INTRAVENOUS

## 2022-01-07 MED ORDER — PROPOFOL 500 MG/50ML IV EMUL
INTRAVENOUS | Status: DC | PRN
  Administered 2022-01-07: 150 ug/kg/min via INTRAVENOUS

## 2022-01-07 MED ORDER — LIDOCAINE HCL (CARDIAC) PF 100 MG/5ML IV SOSY
PREFILLED_SYRINGE | INTRAVENOUS | Status: DC | PRN
  Administered 2022-01-07: 40 mg via INTRAVENOUS

## 2022-01-07 MED ORDER — SODIUM CHLORIDE 0.9 % IV SOLN: INTRAVENOUS | Status: DC

## 2022-01-07 NOTE — Anesthesia Postprocedure Evaluation (Signed)
Anesthesia Post Note  Patient: Jaime Fletcher  Procedure(s) Performed: COLONOSCOPY WITH PROPOFOL  Patient location during evaluation: Endoscopy Anesthesia Type: General Level of consciousness: awake and alert Pain management: pain level controlled Vital Signs Assessment: post-procedure vital signs reviewed and stable Respiratory status: spontaneous breathing, nonlabored ventilation, respiratory function stable and patient connected to nasal cannula oxygen Cardiovascular status: blood pressure returned to baseline and stable Postop Assessment: no apparent nausea or vomiting Anesthetic complications: no   No notable events documented.   Last Vitals:  Vitals:   01/07/22 0923 01/07/22 0933  BP: (!) 93/56 (!) 101/54  Pulse:    Temp:    SpO2: 100%     Last Pain:  Vitals:   01/07/22 0933  TempSrc:   PainSc: 0-No pain                 Precious Haws Brindy Higginbotham

## 2022-01-07 NOTE — H&P (Signed)
Jaime Bellows, MD [address] [place], [place], [place], [address] [address], [place], [place], [place], [address] Phone: [phone]  Fax: [phone]  Primary Care Physician:  Sofie Hartigan, MD   Pre-Procedure History & Physical: HPI:  Jaime Fletcher is a 51 y.o. female is here for an colonoscopy.   Past Medical History:  Diagnosis Date   Abnormal Pap smear of cervix    dysplasia   ADD (attention deficit disorder)    B12 deficiency    Fibroid    GERD (gastroesophageal reflux disease)    Migraine headache    Pneumonia    PTSD (post-traumatic stress disorder)    PTSD (post-traumatic stress disorder)     Past Surgical History:  Procedure Laterality Date   ABDOMINAL HYSTERECTOMY  01/31/2020   APPENDECTOMY     AUGMENTATION MAMMAPLASTY Bilateral 2005   cold knife bx     DILATION AND CURETTAGE OF UTERUS     LAPAROSCOPIC VAGINAL HYSTERECTOMY WITH SALPINGECTOMY Bilateral 01/31/2020   Procedure: LAPAROSCOPIC ASSISTED VAGINAL HYSTERECTOMY WITH BILATERAL SALPINGECTOMY IUD REMOVAL;  Surgeon: Louretta Shorten, MD;  Location: [facility];  Service: Gynecology;  Laterality: Bilateral;   SPINE SURGERY     C3-4 fusion   TONSILLECTOMY      Prior to Admission medications   Medication Sig Start Date End Date Taking? Authorizing Provider  Calcium Citrate-Vitamin D3 (SM CALCIUM CITRATE+VIT D3 MAX) 315-6.25 MG-MCG TABS Take by mouth.   Yes [provider]  celecoxib (CELEBREX) 200 MG capsule Take 1 capsule (200 mg total) by mouth daily. 12/10/21  Yes   cyanocobalamin (,VITAMIN B-12,) 1000 MCG/ML injection Inject 1 mL (1,000 mcg total) into the muscle every 30 (thirty) days. 11/23/19  Yes Juline Patch, MD  diphenhydramine-acetaminophen (TYLENOL PM) 25-500 MG TABS tablet Take 1 tablet by mouth at bedtime as needed.   Yes [provider]  estradiol (VIVELLE-DOT) 0.1 MG/24HR patch Place 1 patch onto the skin twice a week as directed 07/31/21  Yes    fluticasone (FLONASE) 50 MCG/ACT nasal spray INSTILL 2 SPRAYS INTO EACH NOSTRIL DAILYAT BEDTIME Patient taking differently: Place 2 sprays into both nostrils daily as needed (allergies.). 11/23/19  Yes Juline Patch, MD  hydrOXYzine (ATARAX/VISTARIL) 50 MG tablet Take 25-50 mg by mouth 2 (two) times daily as needed for anxiety. 01/10/20  Yes [provider]  ibuprofen (ADVIL) 600 MG tablet Take 1 tablet (600 mg total) by mouth every 6 (six) hours as needed for mild pain or moderate pain. 02/01/20  Yes Louretta Shorten, MD  Ibuprofen-diphenhydrAMINE HCl (ADVIL PM) 200-25 MG CAPS Take 1 tablet by mouth at bedtime as needed (sleep).   Yes [provider]  L-THEANINE PO Take 0.5 drops by mouth at bedtime as needed (rest/anxiousness). 0.5 dropper   Yes [provider]  MAGNESIUM PO Take 325 mg by mouth every evening. Powder   Yes [provider]  meloxicam (MOBIC) 15 MG tablet Take 1 tablet (15 mg total) by mouth once daily 10/31/21  Yes   pantoprazole (PROTONIX) 40 MG tablet Take 1 tablet (40 mg total) by mouth daily. 11/23/19  Yes Juline Patch, MD  pantoprazole (PROTONIX) 40 MG tablet Take 1 tablet (40 mg total) by mouth once daily 12/18/21  Yes   phentermine (ADIPEX-P) 37.5 MG tablet Take 37.5 mg by mouth every morning. 08/21/21  Yes [provider]  phentermine (ADIPEX-P) 37.5 MG tablet Take 1 tablet every day by oral route in the  morning. 12/03/21  Yes   Probiotic Product (PROBIOTIC PEARLS) CAPS Take by mouth.   Yes [provider]  progesterone (PROMETRIUM) 100 MG capsule Take 1 capsule (100 mg total) by mouth at bedtime. 07/31/21  Yes   propranolol (INDERAL) 10 MG tablet Take 1 tablet (10 mg total) by mouth daily as needed. 09/11/21  Yes Gollan, Kathlene November, MD  Sod Picosulfate-Mag Ox-Cit Acd (CLENPIQ) 10-3.5-12 MG-GM -GM/160ML SOLN Take 320 mLs by mouth as directed. 10/04/21  Yes Jaime Bellows, MD  traZODone (DESYREL) 50 MG tablet Take 50 mg by mouth  at bedtime as needed for sleep. 01/10/20  Yes [provider]  Vitamin D, Ergocalciferol, (DRISDOL) 1.25 MG (50000 UNIT) CAPS capsule Take 50,000 Units by mouth every Wednesday. 12/10/19  Yes [provider]  zolpidem (AMBIEN) 10 MG tablet Take 10 mg by mouth at bedtime as needed. 08/21/21  Yes [provider]  amoxicillin-clavulanate (AUGMENTIN) 875-125 MG tablet Take 1 tablet by mouth 2 (two) times daily. 04/03/20   Juline Patch, MD    Allergies as of 10/05/2021 - Review Complete 09/11/2021  Allergen Reaction Noted   Hydrocodone-acetaminophen Nausea And Vomiting 07/17/2012   Prednisone Other (See Comments) 09/14/2010   Amitriptyline Other (See Comments) 09/14/2010    Family History  Problem Relation Age of Onset   Diabetes Mother    Heart attack Mother        x2   Heart disease Mother    Hypertension Mother    Atrial fibrillation Father    Hypertension Father    Breast cancer Paternal Aunt    Heart failure Maternal Grandmother    Ovarian cancer Neg Hx    Colon cancer Neg Hx     Social History   Socioeconomic History   Marital status: Single    Spouse name: Not on file   Number of children: Not on file   Years of education: Not on file   Highest education level: Not on file  Occupational History   Not on file  Tobacco Use   Smoking status: Never   Smokeless tobacco: Never  Vaping Use   Vaping Use: Never used  Substance and Sexual Activity   Alcohol use: Yes    Comment: social   Drug use: No   Sexual activity: Yes    Birth control/protection: I.U.D.  Other Topics Concern   Not on file  Social History Narrative   Not on file   Social Determinants of Health   Financial Resource Strain: Not on file  Food Insecurity: Not on file  Transportation Needs: Not on file  Physical Activity: Insufficiently Active (12/17/2017)   Exercise Vital Sign    Days of Exercise per Week: 2 days    Minutes of Exercise per Session: 30 min  Stress: Not  on file  Social Connections: Not on file  Intimate Partner Violence: Not on file    Review of Systems: See HPI, otherwise negative ROS  Physical Exam: BP (!) 108/47   Pulse 80   Temp 98 F (36.7 C) (Temporal)   Ht '5\' 1"'$  (1.549 m)   Wt 56.2 kg   LMP 01/20/2020   SpO2 97%   BMI 23.43 kg/m  General:   Alert,  pleasant and cooperative in NAD Head:  Normocephalic and atraumatic. Neck:  Supple; no masses or thyromegaly. Lungs:  Clear throughout to auscultation, normal respiratory effort.    Heart:  +S1, +S2, Regular rate and rhythm, No edema. Abdomen:  Soft, nontender and nondistended. Normal  bowel sounds, without guarding, and without rebound.   Neurologic:  Alert and  oriented x4;  grossly normal neurologically.  Impression/Plan: Jaime Fletcher is here for an colonoscopy to be performed for Screening colonoscopy , family history of colon polyps  Risks, benefits, limitations, and alternatives regarding  colonoscopy have been reviewed with the patient.  Questions have been answered.  All parties agreeable.   Jaime Bellows, MD  01/07/2022, 8:24 AM

## 2022-01-07 NOTE — Anesthesia Procedure Notes (Signed)
Date/Time: 01/07/2022 8:52 AM  Performed by: Doreen Salvage, CRNAPre-anesthesia Checklist: Patient identified, Emergency Drugs available, Suction available and Patient being monitored Patient Re-evaluated:Patient Re-evaluated prior to induction Oxygen Delivery Method: Nasal cannula Induction Type: IV induction Dental Injury: Teeth and Oropharynx as per pre-operative assessment  Comments: Nasal cannula with etCO2 monitoring

## 2022-01-07 NOTE — Op Note (Signed)
[facility] Gastroenterology Patient Name: Jaime Fletcher Procedure Date: 01/07/2022 8:26 AM MRN: [id number] Account #: [id number] Date of Birth: 1970-09-03 Admit Type: Outpatient Age: 51 Room: [facility] ENDO ROOM 1 Gender: Female Note Status: Finalized Instrument Name: Jasper Riling [id number] Procedure:             Colonoscopy Indications:           Colon cancer screening in patient at increased risk:                         Family history of 1st-degree relative with colon polyps Providers:             Jonathon Bellows MD, MD Referring MD:          Sofie Hartigan (Referring MD) Medicines:             Monitored Anesthesia Care Complications:         No immediate complications. Procedure:             Pre-Anesthesia Assessment:                        - Prior to the procedure, a History and Physical was                         performed, and patient medications, allergies and                         sensitivities were reviewed. The patient's tolerance                         of previous anesthesia was reviewed.                        - The risks and benefits of the procedure and the                         sedation options and risks were discussed with the                         patient. All questions were answered and informed                         consent was obtained.                        - ASA Grade Assessment: II - A patient with mild                         systemic disease.                        After obtaining informed consent, the colonoscope was                         passed under direct vision. Throughout the procedure,                         the patient's blood pressure, pulse, and oxygen  saturations were monitored continuously. The                         Colonoscope was introduced through the anus and                         advanced to the the cecum, identified by the                         appendiceal orifice. The colonoscopy was  performed                         with ease. The patient tolerated the procedure well.                         The quality of the bowel preparation was excellent.                         The appendiceal orifice was photographed. Findings:      The entire examined colon appeared normal on direct and retroflexion       views. Impression:            - The entire examined colon is normal on direct and                         retroflexion views.                        - No specimens collected. Recommendation:        - Discharge patient to home (with escort).                        - Resume previous diet.                        - Continue present medications.                        - Repeat colonoscopy in 5 years for screening purposes. Procedure Code(s):     --- Professional ---                        [id number], Colonoscopy, flexible; diagnostic, including                         collection of specimen(s) by brushing or washing, when                         performed (separate procedure) Diagnosis Code(s):     --- Professional ---                        Z83.71, Family history of colonic polyps CPT copyright 2022 American [employer] Association. All rights reserved. The codes documented in this report are preliminary and upon coder review may  be revised to meet current compliance requirements. Jonathon Bellows, MD Jonathon Bellows MD, MD 01/07/2022 8:57:30 AM This report has been signed electronically. Number of Addenda: 0 Note Initiated On: 01/07/2022 8:26 AM Scope Withdrawal Time: 0 hours 10 minutes 26 seconds  Total Procedure Duration: 0 hours 14 minutes 36 seconds  Estimated Blood Loss:  Estimated blood loss: none.      [facility]

## 2022-01-07 NOTE — Transfer of Care (Signed)
Immediate Anesthesia Transfer of Care Note  Patient: Jaime Fletcher  Procedure(s) Performed: Procedure(s): COLONOSCOPY WITH PROPOFOL (N/A)  Patient Location: PACU and Endoscopy Unit  Anesthesia Type:General  Level of Consciousness: sedated  Airway & Oxygen Therapy: Patient Spontanous Breathing and Patient connected to nasal cannula oxygen  Post-op Assessment: Report given to RN and Post -op Vital signs reviewed and stable  Post vital signs: Reviewed and stable  Last Vitals:  Vitals:   01/07/22 0903 01/07/22 0906  BP:  136/76  Pulse:    Temp: 36.6 C   SpO2:      Complications: No apparent anesthesia complications

## 2022-01-07 NOTE — Anesthesia Preprocedure Evaluation (Signed)
Anesthesia Evaluation  Patient identified by MRN, date of birth, ID band Patient awake    Reviewed: Allergy & Precautions, NPO status , Patient's Chart, lab work & pertinent test results  History of Anesthesia Complications Negative for: history of anesthetic complications  Airway Mallampati: III  TM Distance: <3 FB Neck ROM: full    Dental  (+) Chipped   Pulmonary neg pulmonary ROS, neg shortness of breath   Pulmonary exam normal        Cardiovascular Exercise Tolerance: Good (-) angina negative cardio ROS Normal cardiovascular exam     Neuro/Psych  Headaches PSYCHIATRIC DISORDERS         GI/Hepatic Neg liver ROS,GERD  Controlled,,  Endo/Other  negative endocrine ROS    Renal/GU negative Renal ROS  negative genitourinary   Musculoskeletal   Abdominal   Peds  Hematology negative hematology ROS (+)   Anesthesia Other Findings Past Medical History: No date: Abnormal Pap smear of cervix     Comment:  dysplasia No date: ADD (attention deficit disorder) No date: B12 deficiency No date: Fibroid No date: GERD (gastroesophageal reflux disease) No date: Migraine headache No date: Pneumonia No date: PTSD (post-traumatic stress disorder) No date: PTSD (post-traumatic stress disorder)  Past Surgical History: 01/31/2020: ABDOMINAL HYSTERECTOMY No date: APPENDECTOMY 2005: AUGMENTATION MAMMAPLASTY; Bilateral No date: cold knife bx No date: DILATION AND CURETTAGE OF UTERUS 01/31/2020: LAPAROSCOPIC VAGINAL HYSTERECTOMY WITH SALPINGECTOMY;  Bilateral     Comment:  Procedure: LAPAROSCOPIC ASSISTED VAGINAL HYSTERECTOMY               WITH BILATERAL SALPINGECTOMY IUD REMOVAL;  Surgeon: Louretta Shorten, MD;  Location: [facility];                Service: Gynecology;  Laterality: Bilateral; No date: SPINE SURGERY     Comment:  C3-4 fusion No date: TONSILLECTOMY  BMI    Body Mass Index: 23.43  kg/m      Reproductive/Obstetrics negative OB ROS                             Anesthesia Physical Anesthesia Plan  ASA: 2  Anesthesia Plan: General   Post-op Pain Management:    Induction: Intravenous  PONV Risk Score and Plan: Propofol infusion and TIVA  Airway Management Planned: Natural Airway and Nasal Cannula  Additional Equipment:   Intra-op Plan:   Post-operative Plan:   Informed Consent: I have reviewed the patients History and Physical, chart, labs and discussed the procedure including the risks, benefits and alternatives for the proposed anesthesia with the patient or authorized representative who has indicated his/her understanding and acceptance.     Dental Advisory Given  Plan Discussed with: Anesthesiologist, CRNA and Surgeon  Anesthesia Plan Comments: (Patient consented for risks of anesthesia including but not limited to:  - adverse reactions to medications - risk of airway placement if required - damage to eyes, teeth, lips or other oral mucosa - nerve damage due to positioning  - sore throat or hoarseness - Damage to heart, brain, nerves, lungs, other parts of body or loss of life  Patient voiced understanding.)       Anesthesia Quick Evaluation

## 2022-01-08 ENCOUNTER — Encounter: Payer: Self-pay | Admitting: Gastroenterology

## 2022-01-09 ENCOUNTER — Telehealth: Payer: Self-pay | Admitting: Gastroenterology

## 2022-01-09 ENCOUNTER — Other Ambulatory Visit: Payer: Self-pay

## 2022-01-09 ENCOUNTER — Ambulatory Visit: Payer: No Typology Code available for payment source | Admitting: Physical Therapy

## 2022-01-09 DIAGNOSIS — M542 Cervicalgia: Secondary | ICD-10-CM

## 2022-01-09 DIAGNOSIS — R278 Other lack of coordination: Secondary | ICD-10-CM

## 2022-01-09 DIAGNOSIS — M533 Sacrococcygeal disorders, not elsewhere classified: Secondary | ICD-10-CM

## 2022-01-09 DIAGNOSIS — R2689 Other abnormalities of gait and mobility: Secondary | ICD-10-CM

## 2022-01-09 MED ORDER — ESTRADIOL 0.1 MG/24HR TD PTTW
MEDICATED_PATCH | TRANSDERMAL | 6 refills | Status: DC
  Filled 2022-01-09: qty 8, 28d supply, fill #0

## 2022-01-09 NOTE — Telephone Encounter (Unsigned)
Pt medical records were Sent to [facility] for D.O.S 12/03/2021. [phone]

## 2022-01-09 NOTE — Telephone Encounter (Signed)
Pt medical record for D.O.S 12/03/2021  was mailed out to [facility] on 01/09/2022

## 2022-01-09 NOTE — Therapy (Signed)
OUTPATIENT PHYSICAL THERAPY Treatment    Patient Name: IDALY VERRET MRN: [id number] DOB:1970-09-07, 51 y.o., female Today's Date: 01/09/2022   PT End of Session - 01/09/22 1507     Visit Number 11    Number of Visits 20    Date for PT Re-Evaluation 03/07/22    PT Start Time 1504    PT Stop Time 7353    PT Time Calculation (min) 41 min    Activity Tolerance Patient tolerated treatment well    Behavior During Therapy [facility] for tasks assessed/performed             Past Medical History:  Diagnosis Date   Abnormal Pap smear of cervix    dysplasia   ADD (attention deficit disorder)    B12 deficiency    Fibroid    GERD (gastroesophageal reflux disease)    Migraine headache    Pneumonia    PTSD (post-traumatic stress disorder)    PTSD (post-traumatic stress disorder)    Past Surgical History:  Procedure Laterality Date   ABDOMINAL HYSTERECTOMY  01/31/2020   APPENDECTOMY     AUGMENTATION MAMMAPLASTY Bilateral 2005   cold knife bx     COLONOSCOPY WITH PROPOFOL N/A 01/07/2022   Procedure: COLONOSCOPY WITH PROPOFOL;  Surgeon: Jonathon Bellows, MD;  Location: [facility] ENDOSCOPY;  Service: Gastroenterology;  Laterality: N/A;   DILATION AND CURETTAGE OF UTERUS     LAPAROSCOPIC VAGINAL HYSTERECTOMY WITH SALPINGECTOMY Bilateral 01/31/2020   Procedure: LAPAROSCOPIC ASSISTED VAGINAL HYSTERECTOMY WITH BILATERAL SALPINGECTOMY IUD REMOVAL;  Surgeon: Louretta Shorten, MD;  Location: [facility];  Service: Gynecology;  Laterality: Bilateral;   SPINE SURGERY     C3-4 fusion   TONSILLECTOMY     Patient Active Problem List   Diagnosis Date Noted   Screen for colon cancer 01/07/2022   S/P laparoscopic assisted vaginal hysterectomy (LAVH) 01/31/2020   Decreased libido 07/03/2018   Cervical stenosis (uterine cervix) 07/03/2018   IUD threads lost 07/03/2018   Breakthrough bleeding associated with intrauterine device (IUD) 07/03/2018   Left ovarian cyst 07/03/2018   Submucous leiomyoma  of uterus 12/17/2017   Paroxysmal tachycardia ([place]) 09/09/2017   Chest pain 09/09/2017   Insomnia 09/09/2017   Depression 08/05/2017   Abnormal Pap smear of cervix 11/20/2016   Anxiety 11/20/2016   Fibromyalgia 11/20/2016   Encounter for insertion of intrauterine contraceptive device (IUD) 11/20/2016   B12 deficiency 06/19/2015   Vitamin D deficiency, unspecified 06/19/2015   Vitamin D deficiency 06/19/2015   Myopic astigmatism of both eyes 08/10/2014   Palpitations 05/19/2013   Attention deficit disorder 08/17/2010   Dysthymic disorder 08/17/2010   Obsessive-compulsive disorder 08/17/2010   Posttraumatic stress disorder 08/17/2010    PCP: Ellison Hughs MD   REFERRING PROVIDER: Louretta Shorten MD   REFERRING DIAG: N39.3 (ICD-10-CM) - Stress incontinence (female) (female)  Rationale for Evaluation and Treatment Rehabilitation  THERAPY DIAG:  Sacrococcygeal disorders, not elsewhere classified  Other abnormalities of gait and mobility  Other lack of coordination  Neck pain on left side  ONSET DATE:  SUI started 2 years ago when she had fibroid the soft ball size which prompted the hysterectomy   SUBJECTIVE:  SUBJECTIVE STATEMENT: Pt will be following up with her PCP re: UTI like Sx and bladder spasms.  Pt reports her L shoulder did not bother her after last session until today when she sat in a different workstation. Pt has not had any numbness down her L arm since last session. Pt did not get to try her HEP last week.   PERTINENT HISTORY:  she had fibroid the soft ball size which prompted the hysterectomy.  Hx of appendectomy. C3-4 fusion.   PAIN:  Are you having pain? no   PRECAUTIONS: None  WEIGHT BEARING RESTRICTIONS No  FALLS:  Has patient fallen in last 6 months? No  LIVING  ENVIRONMENT: Lives with: lives with their family Lives in: House/apartment Stairs: Yes: External: 3 steps; on right going up   OCCUPATION: administrative services   PLOF: Independent  PATIENT GOALS   Get ahead of problems, eliminate leakage altogether, return to gym    OBJECTIVE:     [facility] PT Assessment - 01/09/22 1514       Coordination   Coordination and Movement Description no more: L winging.  Pain with previous HEP with pt did not engage in scapular retraction      AROM   Overall AROM Comments seated cerv ext 50 deg without pain nor numbness down LUE      Palpation   Palpation comment tightness along interspinal cervical mm at level C2-3 L, supraspinatus , intercostals T3-5 L                [facility] Adult PT Treatment/Exercise - 01/09/22 1700       Neuro Re-ed    Neuro Re-ed Details  cued for cervical/ scapular retraciton, proper breathing during pulling of resistance band, to minimize L shoulder pain and minimize rounded shoulder      Modalities   Modalities Moist Heat      Moist Heat Therapy   Number Minutes Moist Heat 5 Minutes    Moist Heat Location --   cervical shoulder during instruction of HEP     Manual Therapy   Internal Pelvic Floor STM/MWM at problem areas noted to promote cervical mobility/ scervical/ scapular retraction L to return to doing previous HEP                   HOME EXERCISE PROGRAM: See pt instruction section     ASSESSMENT:  CLINICAL IMPRESSION:     Past Tx helped to resolve her numbness along LUE. Pt's Hx of cervical fusion surgery is impacting pt's poor propioception and asymmetries.    Continued to focus on pt's L upper quadrant with use of regional interdependent approach to get pt able to perform previously prescribed HEP without shoulder pain.  Pt tolerated manual Tx today and demo'd improved cervical mobility and able to return to past HEP with resistance band. Down graded resistance band from blue to  green and pt required excessive cues for cervico/scapular retraction to minimize L shoulder pain and rounded shoulder.                    Pt will need to learn better cervicoscapular coordination in hooklying position with resistance training before progressing toward her fitness routine with weight lifting. This specificity is required to help pt minimize straining pelvic floor and neck/shoulder pain.   Pt continues to benefit from skilled PT.   OBJECTIVE IMPAIRMENTS decreased coordination, decreased endurance, decreased mobility, decreased ROM, decreased strength, decreased safety awareness, hypomobility, increased fascial restrictions, increased  muscle spasms, improper body mechanics, and postural dysfunction.   ACTIVITY LIMITATIONS continence, toileting, and locomotion level  PARTICIPATION LIMITATIONS: interpersonal relationship, community activity, and occupation  PERSONAL FACTORS  Hx of fall onto her tailbone after falling ice in 2000. Vaginal deliveries for twins and one more child with tears, hysterectomy, appendectomy are also affecting patient's functional outcome.   REHAB POTENTIAL: Good  CLINICAL DECISION MAKING: Evolving/moderate complexity  EVALUATION COMPLEXITY: Moderate   PATIENT EDUCATION:   Education details: Showed pt anatomy images. Explained muscles attachments/ connection, physiology of deep core system/ spinal- thoracic-pelvis-lower kinetic chain as they relate to pt's presentation, Sx, and past Hx. Explained what and how these areas of deficits need to be restored to balance and function   See Therapeutic activity / Neuromuscular education section  Person educated: Patient Education method: Explanation, Demonstration, Tactile cues, Verbal cues, and Handouts Education comprehension: verbalized understanding, returned demonstration, verbal cues required, tactile cues required, and needs further education   PLAN: PT FREQUENCY: 1x/week  PT DURATION: 10  weeks  PLANNED INTERVENTIONS: Therapeutic exercises, Therapeutic activity, Neuromuscular re-education, Balance training, Gait training, Patient/Family education, Self Care, Joint mobilization, Spinal mobilization, Moist heat, scar mobilization, and Taping.  PLAN FOR NEXT SESSION:  See clinical impression for plan     GOALS: Goals reviewed with patient? Yes  SHORT TERM GOALS: Target date: 02/06/2022  Pt will demo IND with HEP Baseline: Not IND   Goal status: MET    LONG TERM GOALS: Target date: 03/20/2022  Pt will demo proper deep core coordination without chest breathing and optimal excursion of diaphragm/pelvic floor in order to promote spinal stability and pelvic floor function   Baseline: poor coordination Goal status: MET   2.  Pt will demo > 5 pt change on PFDI  and urinary problem FOTO to improve QOL and functional mobility   Baseline:  25 pts, 51 pts   ( 11/29/21:  17, 9 pts)  Goal status: MET  3.  Pt will report changing less pads from 1 per day to 0 pads in order to enjoy her community activities  Baseline: 1 pad Goal status:   Pt wears a pad just in case but for 5 out of 7 days, she has no leakage. She has 2 days with leakage per week. ( 10/19/ 23)  On going   4.  Pt will demo medially aligned coccyx, stronger hip abduction mm B in order to progress to pelvic floor HEP/ deep core HEP  Baseline: coccyx deviated to L , hip abd 3+/5 B  ( L 3/5, R 5/5)  Goal status: Partially met   5.  Pt will demo decreased abdominal scar restrictions in order to promote more facial mobility for deep core coordination  Baseline: restricted  Goal status:MET   6.  Pt will demo proper body mechanics in against gravity tasks and ADLs, work tasks, and co-activation of deep core system with gym activities to minimize straining pelvic floor and in continence  Baseline: limited education  Goal status:On going   7.  Pt will demo increased hip ext L 3+/5 to > 4/5 in order to progress to  fitness with less risk for pelvic floor dysfunctions Baseline: 3+/5  Goal Status: NEW   8. Pt will demo increased cervical endurance time of > 20 sec to progress back to bilateral weight lifting routine with minimized risk for injuries  Baseline: 59 sec  Goal status: NEW   9. Pt will demo no ulnar nn radicular pain with > 42  deg in cervical extension seated in order to minimize neck pain and return to bilateral fitness exercises with less risk for injuries.  Baseline:  ulnar nn radicular Sx at 42 deg cerv ext  Goal Status: NEW  Jerl Mina, PT 01/09/2022, 3:12 PM

## 2022-01-10 ENCOUNTER — Encounter: Payer: No Typology Code available for payment source | Admitting: Physical Therapy

## 2022-01-11 ENCOUNTER — Telehealth: Payer: Self-pay | Admitting: Gastroenterology

## 2022-01-11 NOTE — Telephone Encounter (Signed)
Pt medical records for 12/03/2021 were mailed to [facility] on [phone]

## 2022-01-17 ENCOUNTER — Ambulatory Visit: Payer: No Typology Code available for payment source | Admitting: Physical Therapy

## 2022-01-18 ENCOUNTER — Other Ambulatory Visit: Payer: Self-pay

## 2022-01-18 ENCOUNTER — Telehealth: Payer: No Typology Code available for payment source | Admitting: Physician Assistant

## 2022-01-18 DIAGNOSIS — R6889 Other general symptoms and signs: Secondary | ICD-10-CM | POA: Diagnosis not present

## 2022-01-18 MED ORDER — OSELTAMIVIR PHOSPHATE 75 MG PO CAPS
ORAL_CAPSULE | ORAL | 0 refills | Status: DC
  Filled 2022-01-18: qty 10, 5d supply, fill #0

## 2022-01-18 MED ORDER — PROMETHAZINE-DM 6.25-15 MG/5ML PO SYRP
ORAL_SOLUTION | ORAL | 0 refills | Status: DC
  Filled 2022-01-18: qty 118, 6d supply, fill #0

## 2022-01-18 MED ORDER — AZELASTINE HCL 0.1 % NA SOLN
1.0000 | Freq: Two times a day (BID) | NASAL | 0 refills | Status: DC
  Filled 2022-01-18: qty 30, 90d supply, fill #0

## 2022-01-18 MED ORDER — ALBUTEROL SULFATE HFA 108 (90 BASE) MCG/ACT IN AERS
INHALATION_SPRAY | RESPIRATORY_TRACT | 0 refills | Status: DC
  Filled 2022-01-18: qty 6.7, 25d supply, fill #0

## 2022-01-18 MED ORDER — LIDOCAINE VISCOUS HCL 2 % MT SOLN
5.0000 mL | Freq: Four times a day (QID) | OROMUCOSAL | 0 refills | Status: DC | PRN
  Filled 2022-01-18: qty 100, 5d supply, fill #0

## 2022-01-18 MED ORDER — PHENOL 1.4 % MT LIQD
1.0000 | OROMUCOSAL | 0 refills | Status: DC | PRN
  Filled 2022-01-18: qty 118, fill #0

## 2022-01-18 MED ORDER — BENZONATATE 100 MG PO CAPS
100.0000 mg | ORAL_CAPSULE | Freq: Three times a day (TID) | ORAL | 0 refills | Status: DC | PRN
  Filled 2022-01-18: qty 30, 10d supply, fill #0

## 2022-01-18 NOTE — Progress Notes (Signed)
E visit for Flu like symptoms   We are sorry that you are not feeling well.  Here is how we plan to help! Based on what you have shared with me it looks like you may have flu-like symptoms that should be watched but do not seem to indicate anti-viral treatment.  Influenza or "the flu" is   an infection caused by a respiratory virus. The flu virus is highly contagious and persons who did not receive their yearly flu vaccination may "catch" the flu from close contact.  We have anti-viral medications to treat the viruses that cause this infection. They are not a "cure" and only shorten the course of the infection. These prescriptions are most effective when they are given within the first 2 days of "flu" symptoms. Antiviral medication are indicated if you have a high risk of complications from the flu. You should  also consider an antiviral medication if you are in close contact with someone who is at risk. These medications can help patients avoid complications from the flu  but have side effects that you should know. Possible side effects from Tamiflu or oseltamivir include nausea, vomiting, diarrhea, dizziness, headaches, eye redness, sleep problems or other respiratory symptoms. You should not take Tamiflu if you have an allergy to oseltamivir or any to the ingredients in Tamiflu.  Based upon your symptoms and potential risk factors I recommend that you follow the flu symptoms recommendation that I have listed below.  Upper respiratory infections can be transmitted from person to person, with the most common method of transmission being a person's hands.  The virus is able to live on the skin and can infect other persons for up to 2 hours after direct contact.  Also, these can be transmitted when someone coughs or sneezes; thus, it is important to cover the mouth to reduce this risk.  To keep the spread of the illness at [place], good hand hygiene is very important.  This is an infection that is most  likely caused by a virus. There are no specific treatments other than to help you with the symptoms until the infection runs its course.  We are sorry you are not feeling well.  Here is how we plan to help!   For nasal congestion, you may use an oral decongestants such as Mucinex D or if you have glaucoma or high blood pressure use plain Mucinex.  Saline nasal spray or nasal drops can help and can safely be used as often as needed for congestion.  For your congestion, I have prescribed Azelastine nasal spray two sprays in each nostril twice a day  If you do not have a history of heart disease, hypertension, diabetes or thyroid disease, prostate/bladder issues or glaucoma, you may also use Sudafed to treat nasal congestion.  It is highly recommended that you consult with a pharmacist or your primary care physician to ensure this medication is safe for you to take.     If you have a cough, you may use cough suppressants such as Delsym and Robitussin.  If you have glaucoma or high blood pressure, you can also use Coricidin HBP.   For cough I have prescribed for you A prescription cough medication called Tessalon Perles 100 mg. You may take 1-2 capsules every 8 hours as needed for cough  If you have a sore or scratchy throat, use a saltwater gargle-  to  teaspoon of salt dissolved in a 4-ounce to 8-ounce glass of warm water.  Gargle  the solution for approximately 15-30 seconds and then spit.  It is important not to swallow the solution.  You can also use throat lozenges/cough drops and Chloraseptic spray to help with throat pain or discomfort.  Warm or cold liquids can also be helpful in relieving throat pain.  For headache, pain or general discomfort, you can use Ibuprofen or Tylenol as directed.   Some authorities believe that zinc sprays or the use of Echinacea may shorten the course of your symptoms.   ANYONE WHO HAS FLU SYMPTOMS SHOULD: Stay home. The flu is highly contagious and going out or to  work exposes others! Be sure to drink plenty of fluids. Water is fine as well as fruit juices, sodas and electrolyte beverages. You may want to stay away from caffeine or alcohol. If you are nauseated, try taking small sips of liquids. How do you know if you are getting enough fluid? Your urine should be a pale yellow or almost colorless. Get rest. Taking a steamy shower or using a humidifier may help nasal congestion and ease sore throat pain. Using a saline nasal spray works much the same way. Cough drops, hard candies and sore throat lozenges may ease your cough. Line up a caregiver. Have someone check on you regularly.   GET HELP RIGHT AWAY IF: You cannot keep down liquids or your medications. You become short of breath Your fell like you are going to pass out or loose consciousness. Your symptoms persist after you have completed your treatment plan MAKE SURE YOU  Understand these instructions. Will watch your condition. Will get help right away if you are not doing well or get worse.  Your e-visit answers were reviewed by a board certified advanced clinical practitioner to complete your personal care plan.  Depending on the condition, your plan could have included both over the counter or prescription medications.  If there is a problem please reply  once you have received a response from your provider.  Your safety is important to [place].  If you have drug allergies check your prescription carefully.    You can use MyChart to ask questions about today's visit, request a non-urgent call back, or ask for a work or school excuse for 24 hours related to this e-Visit. If it has been greater than 24 hours you will need to follow up with your provider, or enter a new e-Visit to address those concerns.  You will get an e-mail in the next two days asking about your experience.  I hope that your e-visit has been valuable and will speed your recovery. Thank you for using e-visits.  I have spent 5  minutes in review of e-visit questionnaire, review and updating patient chart, medical decision making and response to patient.   Mar Daring, PA-C

## 2022-01-22 ENCOUNTER — Other Ambulatory Visit: Payer: No Typology Code available for payment source

## 2022-01-22 ENCOUNTER — Inpatient Hospital Stay: Admission: RE | Admit: 2022-01-22 | Payer: No Typology Code available for payment source | Source: Ambulatory Visit

## 2022-01-24 ENCOUNTER — Ambulatory Visit: Payer: No Typology Code available for payment source | Admitting: Physical Therapy

## 2022-01-25 ENCOUNTER — Other Ambulatory Visit: Payer: Self-pay

## 2022-01-25 ENCOUNTER — Institutional Professional Consult (permissible substitution): Payer: No Typology Code available for payment source | Admitting: Plastic Surgery

## 2022-01-25 MED ORDER — ESTRADIOL 0.1 MG/24HR TD PTTW
1.0000 | MEDICATED_PATCH | TRANSDERMAL | 6 refills | Status: AC
  Filled 2022-01-25: qty 8, 28d supply, fill #0
  Filled 2022-02-22: qty 8, 28d supply, fill #1
  Filled 2022-11-22: qty 8, 28d supply, fill #2

## 2022-01-29 ENCOUNTER — Encounter: Payer: No Typology Code available for payment source | Admitting: Physical Therapy

## 2022-01-29 ENCOUNTER — Ambulatory Visit
Payer: No Typology Code available for payment source | Attending: Obstetrics and Gynecology | Admitting: Physical Therapy

## 2022-01-29 DIAGNOSIS — M542 Cervicalgia: Secondary | ICD-10-CM | POA: Insufficient documentation

## 2022-01-29 DIAGNOSIS — R278 Other lack of coordination: Secondary | ICD-10-CM | POA: Diagnosis present

## 2022-01-29 DIAGNOSIS — R2689 Other abnormalities of gait and mobility: Secondary | ICD-10-CM | POA: Diagnosis present

## 2022-01-29 DIAGNOSIS — M533 Sacrococcygeal disorders, not elsewhere classified: Secondary | ICD-10-CM | POA: Diagnosis present

## 2022-01-29 NOTE — Therapy (Signed)
OUTPATIENT PHYSICAL THERAPY Treatment    Patient Name: Jaime Fletcher MRN: [id number] DOB:May 10, 1970, 51 y.o., female Today's Date: 01/09/2022   PT End of Session - 01/29/22 1551     Visit Number 12    Number of Visits 20    Date for PT Re-Evaluation 03/07/22    PT Start Time 4098    PT Stop Time 1625    PT Time Calculation (min) 38 min    Activity Tolerance Patient tolerated treatment well    Behavior During Therapy [facility] for tasks assessed/performed             Past Medical History:  Diagnosis Date   Abnormal Pap smear of cervix    dysplasia   ADD (attention deficit disorder)    B12 deficiency    Fibroid    GERD (gastroesophageal reflux disease)    Migraine headache    Pneumonia    PTSD (post-traumatic stress disorder)    PTSD (post-traumatic stress disorder)    Past Surgical History:  Procedure Laterality Date   ABDOMINAL HYSTERECTOMY  01/31/2020   APPENDECTOMY     AUGMENTATION MAMMAPLASTY Bilateral 2005   cold knife bx     COLONOSCOPY WITH PROPOFOL N/A 01/07/2022   Procedure: COLONOSCOPY WITH PROPOFOL;  Surgeon: Jonathon Bellows, MD;  Location: [facility] ENDOSCOPY;  Service: Gastroenterology;  Laterality: N/A;   DILATION AND CURETTAGE OF UTERUS     LAPAROSCOPIC VAGINAL HYSTERECTOMY WITH SALPINGECTOMY Bilateral 01/31/2020   Procedure: LAPAROSCOPIC ASSISTED VAGINAL HYSTERECTOMY WITH BILATERAL SALPINGECTOMY IUD REMOVAL;  Surgeon: Louretta Shorten, MD;  Location: [facility];  Service: Gynecology;  Laterality: Bilateral;   SPINE SURGERY     C3-4 fusion   TONSILLECTOMY     Patient Active Problem List   Diagnosis Date Noted   Screen for colon cancer 01/07/2022   S/P laparoscopic assisted vaginal hysterectomy (LAVH) 01/31/2020   Decreased libido 07/03/2018   Cervical stenosis (uterine cervix) 07/03/2018   IUD threads lost 07/03/2018   Breakthrough bleeding associated with intrauterine device (IUD) 07/03/2018   Left ovarian cyst 07/03/2018   Submucous leiomyoma  of uterus 12/17/2017   Paroxysmal tachycardia ([place]) 09/09/2017   Chest pain 09/09/2017   Insomnia 09/09/2017   Depression 08/05/2017   Abnormal Pap smear of cervix 11/20/2016   Anxiety 11/20/2016   Fibromyalgia 11/20/2016   Encounter for insertion of intrauterine contraceptive device (IUD) 11/20/2016   B12 deficiency 06/19/2015   Vitamin D deficiency, unspecified 06/19/2015   Vitamin D deficiency 06/19/2015   Myopic astigmatism of both eyes 08/10/2014   Palpitations 05/19/2013   Attention deficit disorder 08/17/2010   Dysthymic disorder 08/17/2010   Obsessive-compulsive disorder 08/17/2010   Posttraumatic stress disorder 08/17/2010    PCP: Ellison Hughs MD   REFERRING PROVIDER: Louretta Shorten MD   REFERRING DIAG: N39.3 (ICD-10-CM) - Stress incontinence (female) (female)  Rationale for Evaluation and Treatment Rehabilitation  THERAPY DIAG:  Sacrococcygeal disorders, not elsewhere classified  Other abnormalities of gait and mobility  Other lack of coordination  Neck pain on left side  ONSET DATE:  SUI started 2 years ago when she had fibroid the soft ball size which prompted the hysterectomy   SUBJECTIVE:  SUBJECTIVE STATEMENT: Pt had not been doing much of her HEP because she was sick with Flu . Pt no longer has numbness along her arms   PERTINENT HISTORY:  she had fibroid the soft ball size which prompted the hysterectomy.  Hx of appendectomy. C3-4 fusion.   PAIN:  Are you having pain? no   PRECAUTIONS: None  WEIGHT BEARING RESTRICTIONS No  FALLS:  Has patient fallen in last 6 months? No  LIVING ENVIRONMENT: Lives with: lives with their family Lives in: House/apartment Stairs: Yes: External: 3 steps; on right going up   OCCUPATION: administrative services   PLOF:  Independent  PATIENT GOALS   Get ahead of problems, eliminate leakage altogether, return to gym    OBJECTIVE:    [place] - 01/29/22 1631       Strength   Overall Strength Comments B hip ext 3-/5             [facility] Adult PT Treatment/Exercise - 01/29/22 1629       Neuro Re-ed    Neuro Re-ed Details  cued for less lumbar lordosis, more cervico/scapular retraction,               HOME EXERCISE PROGRAM: See pt instruction section     ASSESSMENT:  CLINICAL IMPRESSION:     Her numbness along LUE continues to be resolved.   Pt's Hx of cervical fusion surgery is impacting pt's poor propioception and asymmetries.    Today, pt demo'd improved cervicoscapular retraction with less cues. Progressed pt to isometric HEP for strengthening gluts. Pt required cued for less lumbar lordosis.   Reviewed deep core level 1-2 which required cues for less ab overuse. Plan to review resistance band HEP to ensure pt can perform without numbness in UE. Plan to assess pelvic floor at next session as well.   Pt continues to benefit from skilled PT.   OBJECTIVE IMPAIRMENTS decreased coordination, decreased endurance, decreased mobility, decreased ROM, decreased strength, decreased safety awareness, hypomobility, increased fascial restrictions, increased muscle spasms, improper body mechanics, and postural dysfunction.   ACTIVITY LIMITATIONS continence, toileting, and locomotion level  PARTICIPATION LIMITATIONS: interpersonal relationship, community activity, and occupation  PERSONAL FACTORS  Hx of fall onto her tailbone after falling ice in 2000. Vaginal deliveries for twins and one more child with tears, hysterectomy, appendectomy are also affecting patient's functional outcome.   REHAB POTENTIAL: Good  CLINICAL DECISION MAKING: Evolving/moderate complexity  EVALUATION COMPLEXITY: Moderate   PATIENT EDUCATION:   Education details: Showed pt anatomy images. Explained  muscles attachments/ connection, physiology of deep core system/ spinal- thoracic-pelvis-lower kinetic chain as they relate to pt's presentation, Sx, and past Hx. Explained what and how these areas of deficits need to be restored to balance and function   See Therapeutic activity / Neuromuscular education section  Person educated: Patient Education method: Explanation, Demonstration, Tactile cues, Verbal cues, and Handouts Education comprehension: verbalized understanding, returned demonstration, verbal cues required, tactile cues required, and needs further education   PLAN: PT FREQUENCY: 1x/week  PT DURATION: 10 weeks  PLANNED INTERVENTIONS: Therapeutic exercises, Therapeutic activity, Neuromuscular re-education, Balance training, Gait training, Patient/Family education, Self Care, Joint mobilization, Spinal mobilization, Moist heat, scar mobilization, and Taping.  PLAN FOR NEXT SESSION:  See clinical impression for plan     GOALS: Goals reviewed with patient? Yes  SHORT TERM GOALS: Target date: 02/06/2022  Pt will demo IND with HEP Baseline: Not IND   Goal status: MET    LONG TERM GOALS: Target  date: 03/20/2022  Pt will demo proper deep core coordination without chest breathing and optimal excursion of diaphragm/pelvic floor in order to promote spinal stability and pelvic floor function   Baseline: poor coordination Goal status: MET   2.  Pt will demo > 5 pt change on PFDI  and urinary problem FOTO to improve QOL and functional mobility   Baseline:  25 pts, 51 pts   ( 11/29/21:  17, 9 pts)  Goal status: MET  3.  Pt will report changing less pads from 1 per day to 0 pads in order to enjoy her community activities  Baseline: 1 pad Goal status:   Pt wears a pad just in case but for 5 out of 7 days, she has no leakage. She has 2 days with leakage per week. ( 10/19/ 23)  On going   4.  Pt will demo medially aligned coccyx, stronger hip abduction mm B in order to progress to  pelvic floor HEP/ deep core HEP  Baseline: coccyx deviated to L , hip abd 3+/5 B  ( L 3/5, R 5/5)  Goal status: Partially met   5.  Pt will demo decreased abdominal scar restrictions in order to promote more facial mobility for deep core coordination  Baseline: restricted  Goal status:MET   6.  Pt will demo proper body mechanics in against gravity tasks and ADLs, work tasks, and co-activation of deep core system with gym activities to minimize straining pelvic floor and in continence  Baseline: limited education  Goal status:On going   7.  Pt will demo increased hip ext L 3+/5 to > 4/5 in order to progress to fitness with less risk for pelvic floor dysfunctions Baseline: 3+/5  Goal Status: NEW   8. Pt will demo increased cervical endurance time of > 20 sec to progress back to bilateral weight lifting routine with minimized risk for injuries  Baseline: 59 sec  Goal status: On going   9. Pt will demo no ulnar nn radicular pain with > 42 deg in cervical extension seated in order to minimize neck pain and return to bilateral fitness exercises with less risk for injuries.  Baseline:  ulnar nn radicular Sx at 42 deg cerv ext  Goal Status: On going   Jerl Mina, PT 01/09/2022, 3:12 PM

## 2022-01-29 NOTE — Patient Instructions (Signed)
Points of contact with each round of deep core level 1-2    __  Criss cross table   Table top position,  6 points of contact: paw hands, knees hip width apart, toes tucked under  Shoulders down and back like squeezing armpits  Not sagging back   L arm up , thumbs up, arm is out like a half"V" shoulder blade slides down and back  R knee straight, toes tucked on the ground,  Lengthen whole spine as if yard stick is balanced on spine, chin tucked   L + R = 1 rep 10 reps   X 2   __  On belly, exhale  single leg lift 30 deg, alternateing  10 reps

## 2022-01-30 ENCOUNTER — Other Ambulatory Visit: Payer: Self-pay

## 2022-02-05 ENCOUNTER — Encounter: Payer: No Typology Code available for payment source | Admitting: Physical Therapy

## 2022-02-12 ENCOUNTER — Other Ambulatory Visit: Payer: Self-pay

## 2022-02-15 ENCOUNTER — Encounter: Payer: Self-pay | Admitting: Physical Therapy

## 2022-02-15 ENCOUNTER — Ambulatory Visit: Payer: 59 | Admitting: Plastic Surgery

## 2022-02-15 ENCOUNTER — Encounter: Payer: Self-pay | Admitting: Plastic Surgery

## 2022-02-15 VITALS — BP 95/63 | HR 74 | Ht 61.0 in | Wt 125.6 lb

## 2022-02-15 DIAGNOSIS — Z803 Family history of malignant neoplasm of breast: Secondary | ICD-10-CM

## 2022-02-15 DIAGNOSIS — T8544XA Capsular contracture of breast implant, initial encounter: Secondary | ICD-10-CM | POA: Insufficient documentation

## 2022-02-15 DIAGNOSIS — Z719 Counseling, unspecified: Secondary | ICD-10-CM

## 2022-02-15 DIAGNOSIS — N644 Mastodynia: Secondary | ICD-10-CM

## 2022-02-15 DIAGNOSIS — F419 Anxiety disorder, unspecified: Secondary | ICD-10-CM

## 2022-02-15 DIAGNOSIS — F32A Depression, unspecified: Secondary | ICD-10-CM

## 2022-02-15 NOTE — Progress Notes (Signed)
Patient ID: Jaime Fletcher, female    DOB: [birth year], 52 y.o.   MRN: [id number]   Chief Complaint  Patient presents with   Consult   Breast Problem    The patient is a 52 year old female here for evaluation of her breasts.  She had implants placed 20 years ago by Dr. Cleotilde Neer and in the [facility] area.  The patient says that that physician is no longer there.  She was probably a B cup and is now a double D cup.  They are textured saline implants.  She thinks that they are not under the muscle.  She would like them replaced.  She is 5 feet 1 inch tall and weighs 125 pounds.  She has a family history of breast cancer in a paternal aunt she also had 2 mammograms this year with the last 1 being in November.  She has multiple cysts particularly in the left breast.  She was wrestling with her boyfriend when she felt a pop in the left breast.  It sounds like and appears to be the capsule that was ruptured and not the implant.  The left breast is nice and soft the right breast is much more firm so likely has the capsule still intact.  Patient also complains of pain and discomfort.  It has gotten worse with time and not better.    Review of Systems  Constitutional: Negative.   HENT: Negative.    Eyes: Negative.   Respiratory: Negative.  Negative for chest tightness and shortness of breath.   Cardiovascular: Negative.   Gastrointestinal: Negative.   Endocrine: Negative.   Genitourinary: Negative.   Musculoskeletal: Negative.     Past Medical History:  Diagnosis Date   Abnormal Pap smear of cervix    dysplasia   ADD (attention deficit disorder)    B12 deficiency    Fibroid    GERD (gastroesophageal reflux disease)    Migraine headache    Pneumonia    PTSD (post-traumatic stress disorder)    PTSD (post-traumatic stress disorder)     Past Surgical History:  Procedure Laterality Date   ABDOMINAL HYSTERECTOMY  01/31/2020   APPENDECTOMY     AUGMENTATION MAMMAPLASTY Bilateral  2005   cold knife bx     COLONOSCOPY WITH PROPOFOL N/A 01/07/2022   Procedure: COLONOSCOPY WITH PROPOFOL;  Surgeon: Jonathon Bellows, MD;  Location: [facility] ENDOSCOPY;  Service: Gastroenterology;  Laterality: N/A;   DILATION AND CURETTAGE OF UTERUS     LAPAROSCOPIC VAGINAL HYSTERECTOMY WITH SALPINGECTOMY Bilateral 01/31/2020   Procedure: LAPAROSCOPIC ASSISTED VAGINAL HYSTERECTOMY WITH BILATERAL SALPINGECTOMY IUD REMOVAL;  Surgeon: Louretta Shorten, MD;  Location: [facility];  Service: Gynecology;  Laterality: Bilateral;   SPINE SURGERY     C3-4 fusion   TONSILLECTOMY        Current Outpatient Medications:    Calcium Citrate-Vitamin D3 (SM CALCIUM CITRATE+VIT D3 MAX) 315-6.25 MG-MCG TABS, Take by mouth., Disp: , Rfl:    cyanocobalamin (,VITAMIN B-12,) 1000 MCG/ML injection, Inject 1 mL (1,000 mcg total) into the muscle every 30 (thirty) days., Disp: 1 mL, Rfl: 5   estradiol (DOTTI) 0.1 MG/24HR patch, Place 1 patch (0.1 mg total) onto the skin 2 (two) times a week., Disp: 8 patch, Rfl: 6   fluticasone (FLONASE) 50 MCG/ACT nasal spray, INSTILL 2 SPRAYS INTO EACH NOSTRIL DAILYAT BEDTIME (Patient taking differently: Place 2 sprays into both nostrils daily as needed (allergies.).), Disp: 11.1 mL, Rfl: 11   hydrOXYzine (ATARAX/VISTARIL) 50  MG tablet, Take 25-50 mg by mouth 2 (two) times daily as needed for anxiety., Disp: , Rfl:    ibuprofen (ADVIL) 600 MG tablet, Take 1 tablet (600 mg total) by mouth every 6 (six) hours as needed for mild pain or moderate pain., Disp: 30 tablet, Rfl: 0   MAGNESIUM PO, Take 325 mg by mouth every evening. Powder, Disp: , Rfl:    pantoprazole (PROTONIX) 40 MG tablet, Take 1 tablet (40 mg total) by mouth once daily, Disp: 90 tablet, Rfl: 0   phentermine (ADIPEX-P) 37.5 MG tablet, Take 1 tablet every day by oral route in the morning., Disp: 30 tablet, Rfl: 2   Probiotic Product (PROBIOTIC PEARLS) CAPS, Take by mouth., Disp: , Rfl:    progesterone (PROMETRIUM) 100 MG  capsule, Take 1 capsule (100 mg total) by mouth at bedtime., Disp: 30 capsule, Rfl: 6   propranolol (INDERAL) 10 MG tablet, Take 1 tablet (10 mg total) by mouth daily as needed., Disp: 90 tablet, Rfl: 3   Vitamin D, Ergocalciferol, (DRISDOL) 1.25 MG (50000 UNIT) CAPS capsule, Take 50,000 Units by mouth every Wednesday., Disp: , Rfl:    zolpidem (AMBIEN) 10 MG tablet, Take 10 mg by mouth at bedtime as needed., Disp: , Rfl:    Objective:   Vitals:   02/15/22 1255  BP: 95/63  Pulse: 74  SpO2: 99%    Physical Exam Vitals and nursing note reviewed.  Constitutional:      Appearance: Normal appearance.  HENT:     Head: Normocephalic and atraumatic.  Cardiovascular:     Rate and Rhythm: Normal rate.     Pulses: Normal pulses.  Pulmonary:     Effort: Pulmonary effort is normal.  Abdominal:     General: There is no distension.     Palpations: Abdomen is soft.     Tenderness: There is no abdominal tenderness.  Musculoskeletal:        General: No swelling or deformity.  Skin:    General: Skin is warm.  Neurological:     Mental Status: She is alert and oriented to person, place, and time.  Psychiatric:        Mood and Affect: Mood normal.        Behavior: Behavior normal.        Thought Content: Thought content normal.        Judgment: Judgment normal.     Assessment & Plan:  Depression, unspecified depression type  Anxiety  Encounter for counseling  Breast pain  Capsular contracture of breast implant, initial encounter  The patient is interested in removing her breast implants and replacing them.  She is also interested in a mastopexy.  I have explained to her that I cannot provide her with any confidence that surgery will change or improve her breast pain.  If she has any cyst she wants drain she should do that prior to the surgery and not at the time or after the surgery.  It increases her risk of infection and implant rupture if she were to do it after the  surgery.  Pictures were obtained of the patient and placed in the chart with the patient's or guardian's permission.   [place], DO

## 2022-02-20 ENCOUNTER — Encounter: Payer: Self-pay | Admitting: Physical Therapy

## 2022-02-22 ENCOUNTER — Other Ambulatory Visit: Payer: Self-pay

## 2022-02-25 ENCOUNTER — Encounter: Payer: Self-pay | Admitting: *Deleted

## 2022-02-25 ENCOUNTER — Other Ambulatory Visit: Payer: Self-pay

## 2022-02-25 DIAGNOSIS — N959 Unspecified menopausal and perimenopausal disorder: Secondary | ICD-10-CM | POA: Diagnosis not present

## 2022-02-25 DIAGNOSIS — R635 Abnormal weight gain: Secondary | ICD-10-CM | POA: Diagnosis not present

## 2022-02-25 DIAGNOSIS — E663 Overweight: Secondary | ICD-10-CM | POA: Diagnosis not present

## 2022-02-25 MED ORDER — PROGESTERONE MICRONIZED 100 MG PO CAPS
100.0000 mg | ORAL_CAPSULE | Freq: Every day | ORAL | 6 refills | Status: AC
  Filled 2022-02-25 – 2022-03-19 (×2): qty 30, 30d supply, fill #0
  Filled 2022-04-16: qty 30, 30d supply, fill #1
  Filled 2022-05-15: qty 30, 30d supply, fill #2
  Filled 2022-06-11: qty 30, 30d supply, fill #3
  Filled 2022-07-11: qty 30, 30d supply, fill #4
  Filled 2022-08-06: qty 30, 30d supply, fill #5

## 2022-02-25 MED ORDER — PHENTERMINE HCL 37.5 MG PO TABS
37.5000 mg | ORAL_TABLET | Freq: Every morning | ORAL | 2 refills | Status: DC
  Filled 2022-02-25 – 2022-05-15 (×2): qty 30, 30d supply, fill #0
  Filled 2022-07-11: qty 30, 30d supply, fill #1
  Filled 2022-08-05: qty 30, 30d supply, fill #2

## 2022-02-25 MED ORDER — ESTRADIOL 0.1 MG/24HR TD PTTW
1.0000 | MEDICATED_PATCH | TRANSDERMAL | 6 refills | Status: DC
  Filled 2022-02-25 – 2022-03-19 (×2): qty 8, 28d supply, fill #0
  Filled 2022-04-16: qty 8, 28d supply, fill #1
  Filled 2022-05-15: qty 8, 28d supply, fill #2
  Filled 2022-06-11: qty 8, 28d supply, fill #3
  Filled 2022-07-11: qty 8, 28d supply, fill #4
  Filled 2022-08-05: qty 8, 28d supply, fill #5

## 2022-03-05 ENCOUNTER — Ambulatory Visit: Payer: 59 | Admitting: Physical Therapy

## 2022-03-07 ENCOUNTER — Other Ambulatory Visit: Payer: Self-pay

## 2022-03-08 ENCOUNTER — Other Ambulatory Visit: Payer: Self-pay

## 2022-03-12 ENCOUNTER — Ambulatory Visit: Payer: 59 | Admitting: Physical Therapy

## 2022-03-14 ENCOUNTER — Telehealth: Payer: Self-pay

## 2022-03-14 NOTE — Telephone Encounter (Signed)
Received referral for patient to schedule her colonoscopy.  She had colonoscopy w/ EGD was performed by Dr. Vicente Males 12/2021. Patient stated that she was told by Dr. Vicente Males after her colonoscopy last year that she could schedule an appt to see him for her frequent heartburn despite taking protonix.  I informed her that I would make Dr. Georgeann Oppenheim CMA Herb Grays know this and have the front office team to contact her to schedule an office visit.  She said Dr. 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  Thanks, [place], [place]

## 2022-03-14 NOTE — Telephone Encounter (Signed)
Called patient to offer her an appointment for today 03/14/2022 but she stated that there was no way that she could so I then offered her 04/08/2022 at 2:15 PM and she stated that she will have to come in till that day. Appointment had been scheduled.

## 2022-03-19 ENCOUNTER — Other Ambulatory Visit: Payer: Self-pay

## 2022-03-20 ENCOUNTER — Ambulatory Visit: Payer: 59 | Admitting: Physical Therapy

## 2022-03-28 ENCOUNTER — Ambulatory Visit: Payer: 59 | Admitting: Physical Therapy

## 2022-04-04 ENCOUNTER — Telehealth: Payer: Self-pay

## 2022-04-04 NOTE — Telephone Encounter (Addendum)
Uploaded for cpt [id number] and [id number].  Per Dr. Keturah Barre, patient will have to have cyst from left breast removed first before removing and replacing implants.

## 2022-04-08 ENCOUNTER — Telehealth: Payer: Self-pay

## 2022-04-08 ENCOUNTER — Ambulatory Visit: Payer: 59 | Admitting: Gastroenterology

## 2022-04-08 ENCOUNTER — Other Ambulatory Visit: Payer: Self-pay

## 2022-04-08 NOTE — Telephone Encounter (Signed)
Approved auth# [id number] CPT [id number], [id number] good from 04/22/22-10/04/22. Placed file on Heather's desk to be be scheduled.

## 2022-04-16 ENCOUNTER — Telehealth: Payer: Self-pay | Admitting: *Deleted

## 2022-04-16 ENCOUNTER — Other Ambulatory Visit: Payer: Self-pay

## 2022-04-16 MED ORDER — CETIRIZINE HCL 10 MG PO TABS
10.0000 mg | ORAL_TABLET | Freq: Every day | ORAL | 0 refills | Status: AC
  Filled 2022-07-11: qty 90, 90d supply, fill #0

## 2022-04-16 MED ORDER — FLUTICASONE PROPIONATE 50 MCG/ACT NA SUSP
2.0000 | Freq: Every day | NASAL | 0 refills | Status: DC
  Filled 2022-04-16: qty 16, 30d supply, fill #0

## 2022-04-16 MED ORDER — PANTOPRAZOLE SODIUM 40 MG PO TBEC
40.0000 mg | DELAYED_RELEASE_TABLET | Freq: Every day | ORAL | 0 refills | Status: DC
  Filled 2022-04-16: qty 90, 90d supply, fill #0

## 2022-04-16 MED ORDER — OLOPATADINE HCL 0.2 % OP SOLN: OPHTHALMIC | 0 refills | Status: AC

## 2022-04-16 NOTE — Telephone Encounter (Signed)
Spoke with patient to schedule surgery. She had questions about the breast lift option. Scheduled a follow up with Dr. Marla Roe this Friday at 2:15p with hopes that she can get any questions answered so we can get her booked for surgery.

## 2022-04-19 ENCOUNTER — Ambulatory Visit: Payer: 59 | Admitting: Plastic Surgery

## 2022-04-19 ENCOUNTER — Telehealth: Payer: Self-pay | Admitting: *Deleted

## 2022-04-19 NOTE — Telephone Encounter (Signed)
Patient left a vm about questions and surgery. Returned call, had to leave a vm

## 2022-05-15 ENCOUNTER — Other Ambulatory Visit: Payer: Self-pay

## 2022-05-20 DIAGNOSIS — N6002 Solitary cyst of left breast: Secondary | ICD-10-CM | POA: Diagnosis not present

## 2022-05-20 DIAGNOSIS — N644 Mastodynia: Secondary | ICD-10-CM | POA: Diagnosis not present

## 2022-05-21 ENCOUNTER — Ambulatory Visit: Payer: 59 | Admitting: Plastic Surgery

## 2022-06-03 DIAGNOSIS — J302 Other seasonal allergic rhinitis: Secondary | ICD-10-CM | POA: Diagnosis not present

## 2022-06-03 DIAGNOSIS — E538 Deficiency of other specified B group vitamins: Secondary | ICD-10-CM | POA: Diagnosis not present

## 2022-06-03 DIAGNOSIS — F411 Generalized anxiety disorder: Secondary | ICD-10-CM | POA: Diagnosis not present

## 2022-06-03 DIAGNOSIS — K219 Gastro-esophageal reflux disease without esophagitis: Secondary | ICD-10-CM | POA: Diagnosis not present

## 2022-06-03 DIAGNOSIS — Z0000 Encounter for general adult medical examination without abnormal findings: Secondary | ICD-10-CM | POA: Diagnosis not present

## 2022-06-03 DIAGNOSIS — E559 Vitamin D deficiency, unspecified: Secondary | ICD-10-CM | POA: Diagnosis not present

## 2022-06-07 ENCOUNTER — Other Ambulatory Visit
Admission: RE | Admit: 2022-06-07 | Discharge: 2022-06-07 | Disposition: A | Discharge status: Home / Self Care | Payer: 59 | Attending: Family Medicine | Admitting: Family Medicine

## 2022-06-07 DIAGNOSIS — Z0000 Encounter for general adult medical examination without abnormal findings: Secondary | ICD-10-CM | POA: Insufficient documentation

## 2022-06-07 LAB — VITAMIN D 25 HYDROXY (VIT D DEFICIENCY, FRACTURES): Vit D, 25-Hydroxy: 34.46 ng/mL (ref 30–100)

## 2022-06-07 LAB — LIPID PANEL
Cholesterol: 195 mg/dL (ref 0–200)
HDL: 54 mg/dL (ref >40)
LDL Cholesterol: 122 mg/dL — ABNORMAL HIGH (ref 0–99)
Total CHOL/HDL Ratio: 3.6 RATIO
Triglycerides: 97 mg/dL (ref <150)
VLDL: 19 mg/dL (ref 0–40)

## 2022-06-07 LAB — COMPREHENSIVE METABOLIC PANEL
ALT: 20 U/L (ref 0–44)
AST: 21 U/L (ref 15–41)
Albumin: 4.2 g/dL (ref 3.5–5.0)
Alkaline Phosphatase: 47 U/L (ref 38–126)
Anion gap: 10 (ref 5–15)
BUN: 27 mg/dL — ABNORMAL HIGH (ref 6–20)
CO2: 24 mmol/L (ref 22–32)
Calcium: 8.9 mg/dL (ref 8.9–10.3)
Chloride: 104 mmol/L (ref 98–111)
Creatinine, Ser: 0.65 mg/dL (ref 0.44–1.00)
GFR, Estimated: >60 mL/min (ref >60)
Glucose, Bld: 96 mg/dL (ref 70–99)
Potassium: 4.2 mmol/L (ref 3.5–5.1)
Sodium: 138 mmol/L (ref 135–145)
Total Bilirubin: 1.3 mg/dL — ABNORMAL HIGH (ref 0.3–1.2)
Total Protein: 7.5 g/dL (ref 6.5–8.1)

## 2022-06-07 LAB — CBC WITH DIFFERENTIAL/PLATELET
Abs Immature Granulocytes: 0.03 10*3/uL (ref 0.00–0.07)
Basophils Absolute: 0 10*3/uL (ref 0.0–0.1)
Basophils Relative: 0 %
Eosinophils Absolute: 0.1 10*3/uL (ref 0.0–0.5)
Eosinophils Relative: 2 %
HCT: 41.1 % (ref 36.0–46.0)
Hemoglobin: 13.8 g/dL (ref 12.0–15.0)
Immature Granulocytes: 1 %
Lymphocytes Relative: 31 %
Lymphs Abs: 2 10*3/uL (ref 0.7–4.0)
MCH: 33.3 pg (ref 26.0–34.0)
MCHC: 33.6 g/dL (ref 30.0–36.0)
MCV: 99.3 fL (ref 80.0–100.0)
Monocytes Absolute: 0.7 10*3/uL (ref 0.1–1.0)
Monocytes Relative: 12 %
Neutro Abs: 3.5 10*3/uL (ref 1.7–7.7)
Neutrophils Relative %: 54 %
Platelets: 244 10*3/uL (ref 150–400)
RBC: 4.14 MIL/uL (ref 3.87–5.11)
RDW: 12.4 % (ref 11.5–15.5)
WBC: 6.4 10*3/uL (ref 4.0–10.5)
nRBC: 0 % (ref 0.0–0.2)

## 2022-06-07 LAB — TSH: TSH: 3.649 u[IU]/mL (ref 0.350–4.500)

## 2022-06-07 LAB — VITAMIN B12: Vitamin B-12: 267 pg/mL (ref 180–914)

## 2022-06-11 ENCOUNTER — Other Ambulatory Visit: Payer: Self-pay

## 2022-06-11 DIAGNOSIS — E559 Vitamin D deficiency, unspecified: Secondary | ICD-10-CM | POA: Diagnosis not present

## 2022-06-11 DIAGNOSIS — R399 Unspecified symptoms and signs involving the genitourinary system: Secondary | ICD-10-CM | POA: Diagnosis not present

## 2022-06-11 DIAGNOSIS — E785 Hyperlipidemia, unspecified: Secondary | ICD-10-CM | POA: Diagnosis not present

## 2022-06-11 DIAGNOSIS — N390 Urinary tract infection, site not specified: Secondary | ICD-10-CM | POA: Diagnosis not present

## 2022-06-18 ENCOUNTER — Other Ambulatory Visit: Payer: Self-pay

## 2022-06-18 MED ORDER — CIPROFLOXACIN HCL 500 MG PO TABS
500.0000 mg | ORAL_TABLET | Freq: Two times a day (BID) | ORAL | 0 refills | Status: DC
  Filled 2022-06-18: qty 14, 7d supply, fill #0

## 2022-07-11 ENCOUNTER — Other Ambulatory Visit: Payer: Self-pay

## 2022-07-11 MED ORDER — FLUTICASONE PROPIONATE 50 MCG/ACT NA SUSP
2.0000 | Freq: Every day | NASAL | 0 refills | Status: AC
  Filled 2022-07-11: qty 16, 30d supply, fill #0

## 2022-08-02 ENCOUNTER — Other Ambulatory Visit: Payer: Self-pay

## 2022-08-02 MED ORDER — XIIDRA 5 % OP SOLN
1.0000 [drp] | Freq: Two times a day (BID) | OPHTHALMIC | 4 refills | Status: DC
  Filled 2022-08-02: qty 180, 90d supply, fill #0

## 2022-08-05 ENCOUNTER — Other Ambulatory Visit: Payer: Self-pay

## 2022-08-06 ENCOUNTER — Other Ambulatory Visit: Payer: Self-pay

## 2022-09-02 DIAGNOSIS — E663 Overweight: Secondary | ICD-10-CM | POA: Diagnosis not present

## 2022-09-02 DIAGNOSIS — Z01419 Encounter for gynecological examination (general) (routine) without abnormal findings: Secondary | ICD-10-CM | POA: Diagnosis not present

## 2022-09-02 DIAGNOSIS — N959 Unspecified menopausal and perimenopausal disorder: Secondary | ICD-10-CM | POA: Diagnosis not present

## 2022-09-02 DIAGNOSIS — Z6824 Body mass index (BMI) 24.0-24.9, adult: Secondary | ICD-10-CM | POA: Diagnosis not present

## 2022-09-03 ENCOUNTER — Other Ambulatory Visit: Payer: Self-pay

## 2022-09-03 MED ORDER — PHENTERMINE HCL 37.5 MG PO TABS
37.5000 mg | ORAL_TABLET | Freq: Every morning | ORAL | 2 refills | Status: DC
  Filled 2022-09-03: qty 30, 30d supply, fill #0
  Filled 2022-11-22: qty 30, 30d supply, fill #1
  Filled 2023-02-18: qty 30, 30d supply, fill #2

## 2022-09-03 MED ORDER — ESTRADIOL 0.1 MG/24HR TD PTTW
1.0000 | MEDICATED_PATCH | TRANSDERMAL | 6 refills | Status: DC
  Filled 2022-09-03: qty 24, 84d supply, fill #0
  Filled 2022-11-22: qty 24, 84d supply, fill #1
  Filled 2023-02-18: qty 24, 84d supply, fill #2
  Filled 2023-05-14: qty 24, 84d supply, fill #3
  Filled 2023-08-07: qty 24, 84d supply, fill #4

## 2022-09-03 MED ORDER — PROGESTERONE MICRONIZED 100 MG PO CAPS
100.0000 mg | ORAL_CAPSULE | Freq: Every day | ORAL | 6 refills | Status: DC
  Filled 2022-09-03: qty 90, 90d supply, fill #0
  Filled 2022-11-22: qty 90, 90d supply, fill #1
  Filled 2023-02-18: qty 90, 90d supply, fill #2
  Filled 2023-08-23: qty 90, 90d supply, fill #3

## 2022-09-06 ENCOUNTER — Ambulatory Visit
Admission: RE | Admit: 2022-09-06 | Discharge: 2022-09-06 | Disposition: A | Discharge status: Home / Self Care | Payer: 59 | Source: Ambulatory Visit | Attending: Cardiovascular Disease | Admitting: Cardiovascular Disease

## 2022-09-06 DIAGNOSIS — I479 Paroxysmal tachycardia, unspecified: Secondary | ICD-10-CM

## 2022-09-16 ENCOUNTER — Encounter: Payer: Self-pay | Admitting: Cardiovascular Disease

## 2022-10-28 ENCOUNTER — Ambulatory Visit: Payer: 59 | Admitting: Cardiovascular Disease

## 2022-11-22 ENCOUNTER — Other Ambulatory Visit: Payer: Self-pay

## 2022-11-22 MED ORDER — PANTOPRAZOLE SODIUM 40 MG PO TBEC
40.0000 mg | DELAYED_RELEASE_TABLET | Freq: Every day | ORAL | 3 refills | Status: DC
  Filled 2022-11-22: qty 90, 90d supply, fill #0
  Filled 2023-05-14: qty 90, 90d supply, fill #1
  Filled 2023-08-23: qty 90, 90d supply, fill #2

## 2022-11-25 ENCOUNTER — Other Ambulatory Visit: Payer: Self-pay

## 2022-12-08 NOTE — Progress Notes (Unsigned)
Cardiology Office Note  Date:  12/09/2022   ID:  Jaime Fletcher, DOB Apr 16, 1970, MRN [id number]  PCP:  [name], MD   Chief Complaint  Patient presents with   12 month follow up     Discuss CT cardiac scoring results. Patient c/o palpitations at times.     HPI:  [name] is a 52 yo woman with past medical history of PTSD ADD OCD/anxiety Palpitations Who presents for follow-up of her tachycardia and chest pain  Last seen by myself in clinic August 2023 In general feels well Some home stress, son is leaving to go into the [facility] for 6 years Rare palpitations, takes minimal propranolol as needed  Hobbies include spending, motorcycle  Prior cardiac imaging reviewed Cardiac CTA September 06, 2022  calcium score 0  Prior stress, lost her mother, had to manage her estate Mother with cardiac disease  Lab work reviewed Total cholesterol 195 LDL 122 TSH 3.6 Normal CMP and CBC  EKG personally reviewed by myself on todays visit EKG Interpretation Date/Time:  Monday December 09 2022 11:49:53 EDT Ventricular Rate:  74 PR Interval:  142 QRS Duration:  84 QT Interval:  376 QTC Calculation: 417 R Axis:   50  Text Interpretation: Normal sinus rhythm with sinus arrhythmia Normal ECG When compared with ECG of 24-Jan-2020 11:43, No significant change was found Confirmed by Julien Nordmann [phone]) on 12/09/2022 11:53:42 AM    PMH:   has a past medical history of Abnormal Pap smear of cervix, ADD (attention deficit disorder), B12 deficiency, Fibroid, GERD (gastroesophageal reflux disease), Migraine headache, Pneumonia, PTSD (post-traumatic stress disorder), and PTSD (post-traumatic stress disorder).  PSH:    Past Surgical History:  Procedure Laterality Date   ABDOMINAL HYSTERECTOMY  01/31/2020   APPENDECTOMY     AUGMENTATION MAMMAPLASTY Bilateral 2005   cold knife bx     COLONOSCOPY WITH PROPOFOL N/A 01/07/2022   Procedure: COLONOSCOPY WITH PROPOFOL;  Surgeon: Wyline Mood, MD;  Location: [facility] ENDOSCOPY;  Service: Gastroenterology;  Laterality: N/A;   DILATION AND CURETTAGE OF UTERUS     LAPAROSCOPIC VAGINAL HYSTERECTOMY WITH SALPINGECTOMY Bilateral 01/31/2020   Procedure: LAPAROSCOPIC ASSISTED VAGINAL HYSTERECTOMY WITH BILATERAL SALPINGECTOMY IUD REMOVAL;  Surgeon: Candice Camp, MD;  Location: [facility];  Service: Gynecology;  Laterality: Bilateral;   SPINE SURGERY     C3-4 fusion   TONSILLECTOMY      Current Outpatient Medications  Medication Sig Dispense Refill   CELERY, DIAGNOSTIC, IJ Take 1 tablet by mouth at bedtime.     cetirizine (ZYRTEC) 10 MG tablet Take 1 tablet (10 mg total) by mouth once daily 90 tablet 0   estradiol (DOTTI) 0.1 MG/24HR patch Place 1 patch (0.1 mg total) onto the skin 2 (two) times a week. 8 patch 6   fluticasone (FLONASE) 50 MCG/ACT nasal spray INSTILL 2 SPRAYS INTO EACH NOSTRIL DAILYAT BEDTIME (Patient taking differently: Place 2 sprays into both nostrils daily as needed (allergies.).) 11.1 mL 11   fluticasone (FLONASE) 50 MCG/ACT nasal spray Place 2 sprays into both nostrils daily. 16 g 0   ibuprofen (ADVIL) 600 MG tablet Take 1 tablet (600 mg total) by mouth every 6 (six) hours as needed for mild pain or moderate pain. 30 tablet 0   Lifitegrast (XIIDRA) 5 % SOLN Place 1 drop into both eyes 2 (two) times daily. 180 each 4   MAGNESIUM PO Take 325 mg by mouth every evening. Powder     Olopatadine HCl 0.2 %  SOLN Place 1 drop into both eyes once daily 5 mL 0   pantoprazole (PROTONIX) 40 MG tablet Take 1 tablet (40 mg total) by mouth once daily 90 tablet 0   phentermine (ADIPEX-P) 37.5 MG tablet Take 1 tablet (37.5 mg total) by mouth in the morning. 30 tablet 2   Probiotic Product (PROBIOTIC PEARLS) CAPS Take by mouth.     progesterone (PROMETRIUM) 100 MG capsule Take 1 capsule (100 mg total) by mouth at bedtime. 30 capsule 6   propranolol (INDERAL) 10 MG tablet Take 1 tablet (10 mg total) by mouth daily as  needed. 90 tablet 3   Vitamin D, Cholecalciferol, 25 MCG (1000 UT) CAPS Take 1,000 Units by mouth daily.     zolpidem (AMBIEN) 10 MG tablet Take 10 mg by mouth at bedtime as needed.     ciprofloxacin (CIPRO) 500 MG tablet Take 1 tablet (500 mg total) by mouth 2 (two) times daily for 7 days (Patient not taking: Reported on 12/09/2022) 14 tablet 0   cyanocobalamin (,VITAMIN B-12,) 1000 MCG/ML injection Inject 1 mL (1,000 mcg total) into the muscle every 30 (thirty) days. (Patient not taking: Reported on 12/09/2022) 1 mL 5   estradiol (DOTTI) 0.1 MG/24HR patch Place 1 patch (0.1 mg total) onto the skin 2 (two) times a week as directed. (Patient not taking: Reported on 12/09/2022) 8 patch 6   estradiol (DOTTI) 0.1 MG/24HR patch Place 1 patch (0.1 mg total) onto the skin twice a week as directed. (Patient not taking: Reported on 12/09/2022) 24 patch 6   hydrOXYzine (ATARAX/VISTARIL) 50 MG tablet Take 25-50 mg by mouth 2 (two) times daily as needed for anxiety. (Patient not taking: Reported on 12/09/2022)     pantoprazole (PROTONIX) 40 MG tablet Take 1 tablet (40 mg total) by mouth daily. (Patient not taking: Reported on 12/09/2022) 90 tablet 3   progesterone (PROMETRIUM) 100 MG capsule Take 1 capsule (100 mg total) by mouth at bedtime. (Patient not taking: Reported on 12/09/2022) 90 capsule 6   No current facility-administered medications for this visit.    Allergies:   Hydrocodone-acetaminophen, Prednisone, and Amitriptyline   Social History:  The patient  reports that she has never smoked. She has never used smokeless tobacco. She reports current alcohol use. She reports that she does not use drugs.   Family History:   family history includes Atrial fibrillation in her father; Breast cancer in her paternal aunt; Diabetes in her mother; Heart attack in her mother; Heart disease in her mother; Heart failure in her maternal grandmother; Hypertension in her father and mother.    Review of  Systems: Review of Systems  Constitutional: Negative.   HENT: Negative.    Respiratory: Negative.    Cardiovascular: Negative.   Gastrointestinal: Negative.   Musculoskeletal: Negative.   Neurological: Negative.   Psychiatric/Behavioral: Negative.    All other systems reviewed and are negative.  PHYSICAL EXAM: VS:  BP 98/70 (BP Location: Left Arm, Patient Position: Sitting, Cuff Size: Normal)   Pulse 74   Ht 5\' 1"  (1.549 m)   Wt 133 lb 4 oz (60.4 kg)   LMP 01/20/2020   SpO2 98%   BMI 25.18 kg/m  , BMI Body mass index is 25.18 kg/m. Constitutional:  oriented to person, place, and time. No distress.  HENT:  Head: Grossly normal Eyes:  no discharge. No scleral icterus.  Neck: No JVD, no carotid bruits  Cardiovascular: Regular rate and rhythm, no murmurs appreciated Pulmonary/Chest: Clear to auscultation bilaterally, no  wheezes or rails Abdominal: Soft.  no distension.  no tenderness.  Musculoskeletal: Normal range of motion Neurological:  normal muscle tone. Coordination normal. No atrophy Skin: Skin warm and dry Psychiatric: normal affect, pleasant   Recent Labs: 06/07/2022: ALT 20; BUN 27; Creatinine, Ser 0.65; Hemoglobin 13.8; Platelets 244; Potassium 4.2; Sodium 138; TSH 3.649    Lipid Panel Lab Results  Component Value Date   CHOL 195 06/07/2022   HDL 54 06/07/2022   LDLCALC 122 (H) 06/07/2022   TRIG 97 06/07/2022      Wt Readings from Last 3 Encounters:  12/09/22 133 lb 4 oz (60.4 kg)  02/15/22 125 lb 9.6 oz (57 kg)  01/07/22 124 lb (56.2 kg)      ASSESSMENT AND PLAN:  Palpitations /paroxysmal tachycardia Prior  monitor showed rare PACs, PVCs Continue propranolol 5 up to 10 mg as needed for symptomatic palpitations/tachycardia For worsening symptoms, repeat Zio monitor could be ordered  Anxiety Managed by psychiatry Prior stress, loss of her mother Son leaving to go into the [employer] for 6 years  Chest pain, unspecified type Denies symptoms of chest  pain Active at baseline CT coronary calcium score of zero, placing her at low risk of cardiac events No strong indication for cholesterol medication for aspirin    Orders Placed This Encounter  Procedures   EKG 12-Lead     Signed, Dossie Arbour, M.D., Ph.D. 12/09/2022  [facility] [place], [place] [phone]

## 2022-12-09 ENCOUNTER — Encounter: Payer: Self-pay | Admitting: Cardiovascular Disease

## 2022-12-09 ENCOUNTER — Ambulatory Visit: Payer: 59 | Attending: Cardiovascular Disease | Admitting: Cardiovascular Disease

## 2022-12-09 VITALS — BP 98/70 | HR 74 | Ht 61.0 in | Wt 133.2 lb

## 2022-12-09 DIAGNOSIS — I479 Paroxysmal tachycardia, unspecified: Secondary | ICD-10-CM | POA: Diagnosis not present

## 2022-12-09 DIAGNOSIS — R002 Palpitations: Secondary | ICD-10-CM | POA: Diagnosis not present

## 2022-12-09 DIAGNOSIS — R0602 Shortness of breath: Secondary | ICD-10-CM

## 2022-12-09 NOTE — Patient Instructions (Signed)

## 2022-12-12 DIAGNOSIS — L578 Other skin changes due to chronic exposure to nonionizing radiation: Secondary | ICD-10-CM | POA: Diagnosis not present

## 2022-12-12 DIAGNOSIS — Z86018 Personal history of other benign neoplasm: Secondary | ICD-10-CM | POA: Diagnosis not present

## 2022-12-12 DIAGNOSIS — Z872 Personal history of diseases of the skin and subcutaneous tissue: Secondary | ICD-10-CM | POA: Diagnosis not present

## 2022-12-12 DIAGNOSIS — L570 Actinic keratosis: Secondary | ICD-10-CM | POA: Diagnosis not present

## 2023-02-18 ENCOUNTER — Other Ambulatory Visit: Payer: Self-pay

## 2023-05-14 ENCOUNTER — Other Ambulatory Visit: Payer: Self-pay

## 2023-05-14 ENCOUNTER — Telehealth: Admitting: Physician Assistant

## 2023-05-14 DIAGNOSIS — J019 Acute sinusitis, unspecified: Secondary | ICD-10-CM

## 2023-05-14 DIAGNOSIS — B9689 Other specified bacterial agents as the cause of diseases classified elsewhere: Secondary | ICD-10-CM | POA: Diagnosis not present

## 2023-05-14 DIAGNOSIS — J302 Other seasonal allergic rhinitis: Secondary | ICD-10-CM | POA: Diagnosis not present

## 2023-05-14 MED ORDER — MONTELUKAST SODIUM 10 MG PO TABS
10.0000 mg | ORAL_TABLET | Freq: Every day | ORAL | 3 refills | Status: DC
Start: 2023-05-14 — End: 2023-09-22
  Filled 2023-05-14: qty 30, 30d supply, fill #0

## 2023-05-14 MED ORDER — AMOXICILLIN-POT CLAVULANATE 875-125 MG PO TABS
1.0000 | ORAL_TABLET | Freq: Two times a day (BID) | ORAL | 0 refills | Status: DC
Start: 2023-05-14 — End: 2023-09-22
  Filled 2023-05-14: qty 14, 7d supply, fill #0

## 2023-05-14 NOTE — Progress Notes (Signed)
 E visit for Allergic Rhinitis We are sorry that you are not feeling well.  Here is how we plan to help!  Based on what you have shared with me it looks like you have Allergic Rhinitis.  Rhinitis is when a reaction occurs that causes nasal congestion, runny nose, sneezing, and itching.  Most types of rhinitis are caused by an inflammation and are associated with symptoms in the eyes ears or throat. There are several types of rhinitis.  The most common are acute rhinitis, which is usually caused by a viral illness, allergic or seasonal rhinitis, and nonallergic or year-round rhinitis.  Nasal allergies occur certain times of the year.  Allergic rhinitis is caused when allergens in the air trigger the release of histamine in the body.  Histamine causes itching, swelling, and fluid to build up in the fragile linings of the nasal passages, sinuses and eyelids.  An itchy nose and clear discharge are common.  I recommend the following over the counter treatments: You should take a daily dose of antihistamine -- I would recommend switching to  Xyzal 5 mg take 1 tablet daily  I also would recommend a nasal spray: Continue your nasal spray once daily.  I am adding on a trial of Singulair to take once daily to get better control of allergies and breathing.   I am concerned for a secondary sinusitis/bronchitis for ongoing uncontrolled allergies. As such, I am going to add on Augmentin (an antibiotic) to take as directed.    HOME CARE:  You can use an over-the-counter saline nasal spray as needed Avoid areas where there is heavy dust, mites, or molds Stay indoors on windy days during the pollen season Keep windows closed in home, at least in bedroom; use air conditioner. Use high-efficiency house air filter Keep windows closed in car, turn AC on re-circulate Avoid playing out with dog during pollen season  GET HELP RIGHT AWAY IF:  If your symptoms do not improve within 10 days You become short of  breath You develop yellow or green discharge from your nose for over 3 days You have coughing fits  MAKE SURE YOU:  Understand these instructions Will watch your condition Will get help right away if you are not doing well or get worse  Thank you for choosing an e-visit. Your e-visit answers were reviewed by a board certified advanced clinical practitioner to complete your personal care plan. Depending upon the condition, your plan could have included both over the counter or prescription medications. Please review your pharmacy choice. Be sure that the pharmacy you have chosen is open so that you can pick up your prescription now.  If there is a problem you may message your provider in MyChart to have the prescription routed to another pharmacy. Your safety is important to [place]. If you have drug allergies check your prescription carefully.  For the next 24 hours, you can use MyChart to ask questions about today's visit, request a non-urgent call back, or ask for a work or school excuse from your e-visit provider. You will get an email in the next two days asking about your experience. I hope that your e-visit has been valuable and will speed your recovery.

## 2023-05-14 NOTE — Progress Notes (Signed)
 I have spent 5 minutes in review of e-visit questionnaire, review and updating patient chart, medical decision making and response to patient.   Piedad Climes, PA-C

## 2023-05-22 ENCOUNTER — Other Ambulatory Visit: Payer: Self-pay

## 2023-07-02 ENCOUNTER — Other Ambulatory Visit: Payer: Self-pay | Admitting: Nurse Practitioner

## 2023-07-02 DIAGNOSIS — R1902 Left upper quadrant abdominal swelling, mass and lump: Secondary | ICD-10-CM | POA: Diagnosis not present

## 2023-07-02 DIAGNOSIS — K219 Gastro-esophageal reflux disease without esophagitis: Secondary | ICD-10-CM | POA: Diagnosis not present

## 2023-07-03 ENCOUNTER — Other Ambulatory Visit: Payer: Self-pay | Admitting: Nurse Practitioner

## 2023-07-03 ENCOUNTER — Encounter: Payer: Self-pay | Admitting: Nurse Practitioner

## 2023-07-03 ENCOUNTER — Ambulatory Visit
Admission: RE | Admit: 2023-07-03 | Discharge: 2023-07-03 | Disposition: A | Discharge status: Home / Self Care | Source: Ambulatory Visit | Attending: Nurse Practitioner | Admitting: Nurse Practitioner

## 2023-07-03 DIAGNOSIS — R1902 Left upper quadrant abdominal swelling, mass and lump: Secondary | ICD-10-CM | POA: Insufficient documentation

## 2023-07-03 DIAGNOSIS — R140 Abdominal distension (gaseous): Secondary | ICD-10-CM | POA: Diagnosis not present

## 2023-07-04 ENCOUNTER — Ambulatory Visit
Admission: RE | Admit: 2023-07-04 | Discharge: 2023-07-04 | Disposition: A | Discharge status: Home / Self Care | Source: Ambulatory Visit | Attending: Nurse Practitioner | Admitting: Nurse Practitioner

## 2023-07-04 DIAGNOSIS — R1012 Left upper quadrant pain: Secondary | ICD-10-CM | POA: Diagnosis not present

## 2023-07-04 DIAGNOSIS — R1902 Left upper quadrant abdominal swelling, mass and lump: Secondary | ICD-10-CM | POA: Insufficient documentation

## 2023-07-04 MED ORDER — IOHEXOL 300 MG/ML  SOLN
100.0000 mL | Freq: Once | INTRAMUSCULAR | Status: AC | PRN
  Administered 2023-07-04: 100 mL via INTRAVENOUS

## 2023-07-24 ENCOUNTER — Other Ambulatory Visit: Payer: Self-pay | Admitting: Family Medicine

## 2023-07-24 DIAGNOSIS — Z1231 Encounter for screening mammogram for malignant neoplasm of breast: Secondary | ICD-10-CM

## 2023-08-01 ENCOUNTER — Ambulatory Visit
Admission: RE | Admit: 2023-08-01 | Discharge: 2023-08-01 | Disposition: A | Discharge status: Home / Self Care | Source: Ambulatory Visit | Attending: Family Medicine | Admitting: Family Medicine

## 2023-08-01 DIAGNOSIS — Z1231 Encounter for screening mammogram for malignant neoplasm of breast: Secondary | ICD-10-CM | POA: Insufficient documentation

## 2023-08-14 ENCOUNTER — Other Ambulatory Visit: Payer: Self-pay

## 2023-08-21 DIAGNOSIS — J302 Other seasonal allergic rhinitis: Secondary | ICD-10-CM | POA: Diagnosis not present

## 2023-08-21 DIAGNOSIS — E785 Hyperlipidemia, unspecified: Secondary | ICD-10-CM | POA: Diagnosis not present

## 2023-08-21 DIAGNOSIS — K219 Gastro-esophageal reflux disease without esophagitis: Secondary | ICD-10-CM | POA: Diagnosis not present

## 2023-08-21 DIAGNOSIS — E559 Vitamin D deficiency, unspecified: Secondary | ICD-10-CM | POA: Diagnosis not present

## 2023-08-21 DIAGNOSIS — E538 Deficiency of other specified B group vitamins: Secondary | ICD-10-CM | POA: Diagnosis not present

## 2023-08-21 DIAGNOSIS — Z0000 Encounter for general adult medical examination without abnormal findings: Secondary | ICD-10-CM | POA: Diagnosis not present

## 2023-08-21 DIAGNOSIS — Z1331 Encounter for screening for depression: Secondary | ICD-10-CM | POA: Diagnosis not present

## 2023-08-21 DIAGNOSIS — F411 Generalized anxiety disorder: Secondary | ICD-10-CM | POA: Diagnosis not present

## 2023-09-01 ENCOUNTER — Ambulatory Visit: Payer: Self-pay | Admitting: Surgery

## 2023-09-08 ENCOUNTER — Ambulatory Visit: Payer: Self-pay | Admitting: Surgery

## 2023-09-15 ENCOUNTER — Ambulatory Visit: Payer: Self-pay | Admitting: Surgery

## 2023-09-22 ENCOUNTER — Other Ambulatory Visit: Payer: Self-pay | Admitting: Surgery

## 2023-09-22 ENCOUNTER — Encounter: Payer: Self-pay | Admitting: Surgery

## 2023-09-22 ENCOUNTER — Ambulatory Visit: Payer: Self-pay | Admitting: Surgery

## 2023-09-22 VITALS — BP 126/66 | HR 75 | Temp 98.7°F | Ht 61.0 in | Wt 138.0 lb

## 2023-09-22 DIAGNOSIS — N6002 Solitary cyst of left breast: Secondary | ICD-10-CM | POA: Diagnosis not present

## 2023-09-22 DIAGNOSIS — N644 Mastodynia: Secondary | ICD-10-CM

## 2023-09-22 NOTE — Progress Notes (Signed)
 Surgical Consultation  09/22/2023  Jaime Fletcher is an 53 y.o. female.   Chief Complaint  Patient presents with   New Patient (Initial Visit)    HPI:  This is a 53 year old female seen in for a benign breast cyst. She did have a history of bilateral breast implants more than 20 years ago.  She thinks the implants wear subpectoral and there were saline.  No available reports Endorses intermittent breast pain mostly on the left side that is mild to moderate sharp and worsening with certain movements. She did have a mammogram that I personally reviewed last month showing evidence of benign cyst.  No suspicious lesions. She also had a CT of the abdomen pelvis that I personally reviewed showing no acute intra-abdominal abnormalities He did see Dr. Tye 2 years ago and also had ultrasound with a last option for aspiration. Abd hysterectomy, abdominoplasty and appendectomy in the past  Paternal aunt breast CA at early age.  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  Past Medical History:  Diagnosis Date   Abnormal Pap smear of cervix    dysplasia   ADD (attention deficit disorder)    B12 deficiency    Fibroid    GERD (gastroesophageal reflux disease)    Migraine headache    Pneumonia    PTSD (post-traumatic stress disorder)    PTSD (post-traumatic stress disorder)     Past Surgical History:  Procedure Laterality Date   ABDOMINAL HYSTERECTOMY  01/31/2020   APPENDECTOMY     AUGMENTATION MAMMAPLASTY Bilateral 2005   cold knife bx     COLONOSCOPY WITH PROPOFOL  N/A 01/07/2022   Procedure: COLONOSCOPY WITH PROPOFOL ;  Surgeon: Therisa Bi, MD;  Location: [facility] ENDOSCOPY;  Service: Gastroenterology;  Laterality: N/A;   DILATION AND CURETTAGE OF UTERUS     LAPAROSCOPIC VAGINAL HYSTERECTOMY WITH SALPINGECTOMY Bilateral 01/31/2020   Procedure: LAPAROSCOPIC ASSISTED VAGINAL HYSTERECTOMY WITH  BILATERAL SALPINGECTOMY IUD REMOVAL;  Surgeon: Marget Lenis, MD;  Location: [facility];  Service: Gynecology;  Laterality: Bilateral;   SPINE SURGERY     C3-4 fusion   TONSILLECTOMY      Family History  Problem Relation Age of Onset   Diabetes Mother    Heart attack Mother        x2   Heart disease Mother    Hypertension Mother    Atrial fibrillation Father    Hypertension Father    Breast cancer Paternal Aunt    Heart failure Maternal Grandmother    Ovarian cancer Neg Hx    Colon cancer Neg Hx     Social History:  reports that she has never smoked. She has never used smokeless tobacco. She reports current alcohol use. She reports that she does not use drugs.  Allergies:  Allergies  Allergen Reactions   Hydrocodone-Acetaminophen  Nausea And Vomiting   Prednisone Other (See Comments)    Insomnia, violence Violent, insomnia Insomnia, violence   Amitriptyline Other (See Comments)    Tremors  Tremors     Medications reviewed.     ROS Full ROS performed and is otherwise negative other than what is stated in the HPI    BP 126/66   Pulse 75   Temp 98.7 F (37.1 C) (Oral)   Ht 5' 1 (1.549 m)   Wt 138 lb (62.6 kg)   LMP 01/20/2020   SpO2 99%   BMI  26.07 kg/m   Physical Exam Vitals and nursing note reviewed. Exam conducted with a chaperone present.  Constitutional:      General: She is not in acute distress.    Appearance: Normal appearance. She is normal weight. She is not ill-appearing.  Cardiovascular:     Rate and Rhythm: Normal rate and regular rhythm.     Heart sounds: No murmur heard. Pulmonary:     Effort: Pulmonary effort is normal. No respiratory distress.     Breath sounds: Normal breath sounds. No stridor. No wheezing or rhonchi.     Comments: BREAST he does have bilateral breast implants.  On the left breast upper inner quadrant there is a 1 x 1 cm small cystic lesion located 4 cm from nipple areolar complex at 1130. No other  palpable lesions. Axilla free of disease Abdominal:     General: Abdomen is flat. There is no distension.     Palpations: There is no mass.     Tenderness: There is no abdominal tenderness. There is no guarding or rebound.     Hernia: No hernia is present.  Musculoskeletal:        General: Normal range of motion.     Cervical back: Normal range of motion and neck supple. No rigidity or tenderness.  Skin:    General: Skin is warm and dry.     Capillary Refill: Capillary refill takes less than 2 seconds.  Neurological:     General: No focal deficit present.     Mental Status: She is alert and oriented to person, place, and time.  Psychiatric:        Mood and Affect: Mood normal.        Behavior: Behavior normal.        Thought Content: Thought content normal.        Judgment: Judgment normal.    Assessment/Plan: 53 year old female with a left breast cystic area within the upper inner quadrant.  Given prior augmentation breast surgery and prior cystic disease I do recommend further workup.  We will obtain ultrasound-guided images as well as an MRI given density of her breast tissue and given all confounding factors that makes further breast lesion challenging to detect. Cussed with her that we will revisit imaging studies and depending on images we will guide her therapy.  At this time I do not see any strong reasons to perform biopsy or any procedures at this time.  I personally spent a total of 60 minutes in the care of the patient today including performing a medically appropriate exam/evaluation, counseling and educating, placing orders, referring and communicating with other health care professionals, documenting clinical information in the EHR, independently interpreting and reviewing images studies and coordinating care.      Laneta Luna, MD [facility] General Surgeon

## 2023-09-22 NOTE — Patient Instructions (Addendum)
 We have you scheduled for a Breast MRI at [facility] on Tuesday 09/30/23 1pm, arrive at 12:30 in medical mall (no restrictions)   Raymondo will call you to get the U/S scheduled  If you have any questions or concerns please call the office

## 2023-09-30 ENCOUNTER — Ambulatory Visit

## 2023-10-01 ENCOUNTER — Other Ambulatory Visit: Payer: Self-pay | Admitting: Surgery

## 2023-10-01 ENCOUNTER — Telehealth: Payer: Self-pay | Admitting: Surgery

## 2023-10-01 ENCOUNTER — Ambulatory Visit
Admission: RE | Admit: 2023-10-01 | Discharge: 2023-10-01 | Disposition: A | Discharge status: Home / Self Care | Source: Ambulatory Visit | Attending: Surgery

## 2023-10-01 ENCOUNTER — Ambulatory Visit
Admission: RE | Admit: 2023-10-01 | Discharge: 2023-10-01 | Disposition: A | Discharge status: Home / Self Care | Source: Ambulatory Visit | Attending: Surgery | Admitting: Surgery

## 2023-10-01 DIAGNOSIS — R9230 Dense breasts, unspecified: Secondary | ICD-10-CM | POA: Diagnosis not present

## 2023-10-01 DIAGNOSIS — N644 Mastodynia: Secondary | ICD-10-CM | POA: Diagnosis not present

## 2023-10-01 DIAGNOSIS — N6002 Solitary cyst of left breast: Secondary | ICD-10-CM | POA: Diagnosis not present

## 2023-10-01 DIAGNOSIS — Z5309 Procedure and treatment not carried out because of other contraindication: Secondary | ICD-10-CM

## 2023-10-01 DIAGNOSIS — Z1389 Encounter for screening for other disorder: Secondary | ICD-10-CM | POA: Diagnosis not present

## 2023-10-01 MED ORDER — GADOBUTROL 1 MMOL/ML IV SOLN
6.0000 mL | Freq: Once | INTRAVENOUS | Status: AC | PRN
  Administered 2023-10-01: 6 mL via INTRAVENOUS

## 2023-10-01 NOTE — Telephone Encounter (Signed)
 Pt had breast mri done today and the results look ok except for the 3 cyst. She wants to know if she still needs the diag mammo and the breast u/s that is scheduled for tomorrow 10-02-2023. She does not want to have to pay for them if you do not think that they are needed now. Please advise Pt [phone] before 5pm and  after [phone]

## 2023-10-01 NOTE — Telephone Encounter (Signed)
 Per Dr Jordis after reviewing the MRI if she is comfortable with waiting for her annual mammogram he is alright with that. She will follow up as scheduled.

## 2023-10-02 ENCOUNTER — Encounter

## 2023-10-02 ENCOUNTER — Other Ambulatory Visit

## 2023-10-08 ENCOUNTER — Ambulatory Visit: Admitting: Surgery

## 2023-10-08 ENCOUNTER — Encounter: Payer: Self-pay | Admitting: Surgery

## 2023-10-08 VITALS — BP 113/53 | HR 82 | Temp 98.2°F | Ht 61.0 in | Wt 138.2 lb

## 2023-10-08 DIAGNOSIS — N6002 Solitary cyst of left breast: Secondary | ICD-10-CM

## 2023-10-08 NOTE — Patient Instructions (Signed)
 Breast Cyst  A breast cyst is a sac in the breast that is filled with fluid. These cysts are usually noncancerous (benign). Breast cysts are most often in the upper, outer portion of the breast. One or more cysts may develop. They form when fluid builds up inside the breast glands. There are several types of breast cysts. Some are too small to feel but can be seen with imaging tests, such as an X-ray of the breast (mammogram) or ultrasound. Breast cysts do not increase your risk of breast cancer. They usually disappear after you no longer have a menstrual cycle (after menopause), unless you take artificial hormones (are on hormone replacement therapy or menopausal hormone therapy). What are the causes? This condition may be caused by: Blockage of tubes (ducts) in the breast glands, which leads to fluid buildup. Duct blockage may result from: Fibrocystic breast changes. This is a common, benign condition that occurs when women go through hormonal changes during the menstrual cycle. This is a common cause of multiple breast cysts. Overgrowth of breast tissue or breast glands. Scar tissue in the breast from previous surgery. Changes in certain female hormones (estrogen and progesterone). The exact cause of this condition is not known. What increases the risk? You may be more likely to develop breast cysts from 52 years of age until the time of menopause. What are the signs or symptoms? Symptoms of this condition include: Feeling one or more smooth, round, soft lumps (like grapes) in the breast that are easily movable. The lump or lumps may get bigger and more painful before your menstrual period and get smaller after your menstrual period. They may also be painless. Breast discomfort or pain. How is this diagnosed? This condition may be diagnosed based on: A physical exam. Depending on the size of the cyst, your health care provider may be able to feel it during this exam. Imaging tests, such as a  mammogram or ultrasound. Fluid may be removed from the cyst with a needle (fine-needle aspiration) and tested to make sure the cyst is not cancerous. How is this treated? Treatment may not be needed for this condition. Your health care provider may monitor the cyst to see if it goes away on its own. If the cyst is uncomfortable or gets bigger, or if you do not like how the cyst makes your breast look, you may need treatment. Treatment may include: Hormone therapy. Fine-needle aspiration to drain fluid from the cyst. There is a chance of the cyst coming back (recurring) after aspiration. Surgery to remove the cyst. Follow these instructions at home: Self-exams  Talk to your health care provider about breast self-exams. If recommended, do the exams as told. A breast self-exam involves: Comparing your breasts in the mirror. Looking for visible changes in your skin or nipples. Feeling for lumps or changes. Having many breast cysts may make it harder to feel for new lumps. Understand how your breasts normally look and feel, and write down any changes in your breasts. Tell your health care provider about any changes. Eating and drinking Follow instructions from your health care provider about what you may eat and drink. Drink enough fluid to keep your urine pale yellow. Avoid caffeine. Cut down on salt (sodium) in what you eat and drink, especially before your menstrual period. Too much sodium can cause fluid buildup, breast swelling, and discomfort. General instructions See your health care provider regularly. Get a yearly physical exam. If you are 33-8 years old, get a clinical  breast exam every 1-3 years. After the age of 40 years, get this exam every year. Get mammograms as often as directed. Take over-the-counter and prescription medicines only as told by your health care provider. Wear a supportive bra, especially when exercising. Contact a health care provider if: You feel, or think you  feel, a lump in your breast. You notice that both breasts look or feel different than usual. Your breast is still causing pain after your menstrual period is over. You find new lumps or bumps that were not there before. You feel lumps in your armpit. Get help right away if: You have severe pain, tenderness, redness, or warmth in your breast. You have fluid or blood leaking from your nipple. Your breast lump becomes hard and painful. You notice dimpling or wrinkling of the breast or nipple. Summary A breast cyst is a sac in the breast that is filled with fluid. Treatment may not be needed for this condition. If the cyst is uncomfortable or gets bigger, or if you do not like how the cyst makes your breast look, you may need treatment. This information is not intended to replace advice given to you by your health care provider. Make sure you discuss any questions you have with your health care provider. Document Revised: 03/24/2021 Document Reviewed: 03/24/2021 Elsevier Patient Education  2024 [employer].

## 2023-10-10 NOTE — Progress Notes (Signed)
 Outpatient Surgical Follow Up  10/10/2023  Jaime Fletcher is an 53 y.o. female.   Chief Complaint  Patient presents with   Follow-up    Left breast    HPI:  This is a 53 year old female seen in F/U for a benign breast cyst. She did have a history of bilateral breast implants more than 20 years ago.  She thinks the implants wear subpectoral and there were saline.  No available reports Endorses intermittent breast pain mostly on the left side that is mild to moderate sharp and worsening with certain movements. She did have a mammogram that I personally reviewed last month showing evidence of benign cyst.  No suspicious lesions. We recently ordered an MRI of the breast that I personally reviewed showing benign cystic disease in the left side no evidence of any suspicious malignancy.  She does have retromuscular breast implants We did talk about clinical follow-up of the cyst , she is in agreement. Prior abd hysterectomy, abdominoplasty and appendectomy in the past  Paternal aunt breast CA at early age.  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   Past Medical History:  Diagnosis Date   Abnormal Pap smear of cervix    dysplasia   ADD (attention deficit disorder)    B12 deficiency    Fibroid    GERD (gastroesophageal reflux disease)    Migraine headache    Pneumonia    PTSD (post-traumatic stress disorder)    PTSD (post-traumatic stress disorder)     Past Surgical History:  Procedure Laterality Date   ABDOMINAL HYSTERECTOMY  01/31/2020   APPENDECTOMY     AUGMENTATION MAMMAPLASTY Bilateral 2005   cold knife bx     COLONOSCOPY WITH PROPOFOL  N/A 01/07/2022   Procedure: COLONOSCOPY WITH PROPOFOL ;  Surgeon: Therisa Bi, MD;  Location: [facility] ENDOSCOPY;  Service: Gastroenterology;  Laterality: N/A;   DILATION AND CURETTAGE OF UTERUS     LAPAROSCOPIC VAGINAL HYSTERECTOMY WITH SALPINGECTOMY Bilateral 01/31/2020   Procedure: LAPAROSCOPIC ASSISTED VAGINAL HYSTERECTOMY WITH BILATERAL  SALPINGECTOMY IUD REMOVAL;  Surgeon: Marget Lenis, MD;  Location[facility];  Service: Gynecology;  Laterality: Bilateral;   SPINE SURGERY     C3-4 fusion   TONSILLECTOMY      Family History  Problem Relation Age of Onset   Diabetes Mother    Heart attack Mother        x2   Heart disease Mother    Hypertension Mother    Atrial fibrillation Father    Hypertension Father    Breast cancer Paternal Aunt    Heart failure Maternal Grandmother    Ovarian cancer Neg Hx    Colon cancer Neg Hx     Social History:  reports that she has never smoked. She has never used smokeless tobacco. She reports current alcohol use. She reports that she does not use drugs.  Allergies:  Allergies  Allergen Reactions   Hydrocodone-Acetaminophen  Nausea And Vomiting   Prednisone Other (See Comments)    Insomnia, violence Violent, insomnia Insomnia, violence   Amitriptyline Other (See Comments)    Tremors  Tremors     Medications reviewed.    ROS Full ROS performed and is otherwise negative other than what is stated in HPI   BP (!) 113/53   Pulse 82   Temp 98.2 F (36.8 C) (Oral)   Ht 5' 1 (1.549 m)   Wt 138 lb 3.2 oz (62.7 kg)   LMP 01/20/2020   SpO2 97%   BMI 26.11  kg/m   Physical Exam   Vitals and nursing note reviewed. Exam conducted with a chaperone present.  Constitutional:      General: She is not in acute distress.    Appearance: Normal appearance. She is normal weight. She is not ill-appearing.  Cardiovascular:     Rate and Rhythm: Normal rate and regular rhythm.     Heart sounds: No murmur heard. Pulmonary:     Effort: Pulmonary effort is normal. No respiratory distress.     Breath sounds: Normal breath sounds. No stridor. No wheezing or rhonchi.     Comments: BREAST he does have bilateral breast implants.  On the left breast upper inner quadrant there is a 1 x 1 cm small cystic lesion located 4 cm from nipple areolar complex at 1130. No other palpable  lesions. Axilla free of disease Abdominal:     General: Abdomen is flat. There is no distension.     Palpations: There is no mass.     Tenderness: There is no abdominal tenderness. There is no guarding or rebound.     Hernia: No hernia is present.  Musculoskeletal:        General: Normal range of motion.     Cervical back: Normal range of motion and neck supple. No rigidity or tenderness.  Skin:    General: Skin is warm and dry.     Capillary Refill: Capillary refill takes less than 2 seconds.  Neurological:     General: No focal deficit present.     Mental Status: She is alert and oriented to person, place, and time.  Psychiatric:        Mood and Affect: Mood normal.        Behavior: Behavior normal.        Thought Content: Thought content normal.        Judgment: Judgment normal.     Assessment/Plan: Simple cyst on the breast at this time no evidence of suspicious lesions that warrant biopsies or surgical intervention.  We talked in detail about her imaging studies and about the findings on physical exam.  She wishes to follow-up with me in about 6 months for a physical exam. I personally spent a total of 30 minutes in the care of the patient today including performing a medically appropriate exam/evaluation, counseling and educating, placing orders, referring and communicating with other health care professionals, documenting clinical information in the EHR, independently interpreting and reviewing images studies and coordinating care.   Laneta Luna, MD [facility] General Surgeon

## 2023-10-23 ENCOUNTER — Other Ambulatory Visit: Payer: Self-pay

## 2023-10-23 DIAGNOSIS — N959 Unspecified menopausal and perimenopausal disorder: Secondary | ICD-10-CM | POA: Diagnosis not present

## 2023-10-23 DIAGNOSIS — Z6826 Body mass index (BMI) 26.0-26.9, adult: Secondary | ICD-10-CM | POA: Diagnosis not present

## 2023-10-23 DIAGNOSIS — Z01419 Encounter for gynecological examination (general) (routine) without abnormal findings: Secondary | ICD-10-CM | POA: Diagnosis not present

## 2023-10-23 DIAGNOSIS — E663 Overweight: Secondary | ICD-10-CM | POA: Diagnosis not present

## 2023-10-23 MED ORDER — ESTRADIOL 0.1 MG/24HR TD PTTW
2.0000 | MEDICATED_PATCH | TRANSDERMAL | 6 refills | Status: AC
  Filled 2023-10-23: qty 24, 42d supply, fill #0
  Filled 2023-10-23 – 2023-11-03 (×2): qty 48, 84d supply, fill #0
  Filled 2023-11-04: qty 16, 28d supply, fill #0
  Filled 2023-11-10: qty 24, 84d supply, fill #0
  Filled 2023-11-24: qty 48, 84d supply, fill #0

## 2023-10-23 MED ORDER — PHENTERMINE HCL 37.5 MG PO TABS
37.5000 mg | ORAL_TABLET | Freq: Every morning | ORAL | 2 refills | Status: AC
  Filled 2023-10-23 – 2023-11-03 (×2): qty 30, 30d supply, fill #0
  Filled 2024-02-04: qty 30, 30d supply, fill #1

## 2023-10-23 MED ORDER — PROGESTERONE MICRONIZED 100 MG PO CAPS
100.0000 mg | ORAL_CAPSULE | Freq: Every day | ORAL | 6 refills | Status: AC
  Filled 2023-10-23 – 2023-11-03 (×2): qty 90, 90d supply, fill #0
  Filled 2024-02-04 – 2024-02-06 (×2): qty 90, 90d supply, fill #1

## 2023-11-03 ENCOUNTER — Telehealth: Admitting: Family Medicine

## 2023-11-03 ENCOUNTER — Other Ambulatory Visit: Payer: Self-pay

## 2023-11-03 DIAGNOSIS — T63421A Toxic effect of venom of ants, accidental (unintentional), initial encounter: Secondary | ICD-10-CM | POA: Diagnosis not present

## 2023-11-03 MED ORDER — HYDROXYZINE PAMOATE 25 MG PO CAPS
25.0000 mg | ORAL_CAPSULE | Freq: Three times a day (TID) | ORAL | 0 refills | Status: AC | PRN
  Filled 2023-11-03: qty 30, 10d supply, fill #0

## 2023-11-03 MED ORDER — TRIAMCINOLONE ACETONIDE 0.025 % EX OINT
1.0000 | TOPICAL_OINTMENT | Freq: Two times a day (BID) | CUTANEOUS | 0 refills | Status: AC
  Filled 2023-11-03: qty 30, 15d supply, fill #0

## 2023-11-03 MED ORDER — CEPHALEXIN 500 MG PO CAPS
500.0000 mg | ORAL_CAPSULE | Freq: Three times a day (TID) | ORAL | 0 refills | Status: AC
  Filled 2023-11-03: qty 21, 7d supply, fill #0

## 2023-11-03 NOTE — Addendum Note (Signed)
 Addended by: MOISHE CHIQUITA HERO on: 11/03/2023 03:39 PM   Modules accepted: Orders

## 2023-11-03 NOTE — Progress Notes (Signed)
 I have spent 5 minutes in review of e-visit questionnaire, review and updating patient chart, medical decision making and response to patient.   Elsie Velma Lunger, PA-C

## 2023-11-03 NOTE — Progress Notes (Signed)
 E-Visit for Insect Sting  Thank you for describing the insect sting for us .  Here is how we plan to help!  Based on the information you have shared with me it looks like you have:  The 2 greatest risks from insect stings are allergic reaction, which can be fatal in some people and infection, which is more common and less serious.   Based on your information I have: and Provided a home care guide for insect stings and instructions on when to call for help. I have sent in an antibiotic, Keflex , to take three times daily for 7 days. Since you are allergic to prednisone, I have sent in a script for Hydroxyzine  for itch and inflammation.   What can be used to prevent Insect Stings?  Insect repellant with at least 20% DEET.  Wearing long pants and shirts with socks and shoes.  Wear dark or drab-colored clothes rather than bright colors.  Avoid using perfumes and hair sprays; these attract insects.  HOME CARE ADVICE:  1. Stinger removal: The stinger looks like a tiny black dot in the sting. Use a fingernail, credit card edge, or knife-edge to scrape it off.  Don't pull it out because it squeezes out more venom. If the stinger is below the skin surface, leave it alone.  It will be shed with normal skin healing. 2. Use cold compresses to the area of the sting for 10-20 minutes.  You may repeat this as needed to relieve symptoms of pain and swelling. 3.  For pain relief, take acetominophen 650 mg 4-6 hours as needed or ibuprofen  400 mg every 6-8 hours as needed or naproxen  250-500 mg every 12 hours as needed. 4.  You can also use hydrocortisone cream 0.5% or 1% up to 4 times daily as needed for itching. 5.  If the sting becomes very itchy, take Benadryl  25-50 mg, follow directions on box. 6.  Wash the area 2-3 times daily with antibacterial soap and warm water. 7. Call your Doctor if: Fever, a severe headache, or rash occur in the next 2 weeks. Sting area begins to look infected. Redness  and swelling worsens after home treatment. Your current symptoms become worse.    MAKE SURE YOU:  Understand these instructions. Will watch your condition. Will get help right away if you are not doing well or get worse.  Thank you for choosing an e-visit.  Your e-visit answers were reviewed by a board certified advanced clinical practitioner to complete your personal care plan. Depending upon the condition, your plan could have included both over the counter or prescription medications.  Please review your pharmacy choice. Make sure the pharmacy is open so you can pick up prescription now. If there is a problem, you may contact your provider through [employer] and have the prescription routed to another pharmacy.  Your safety is important to us . If you have drug allergies check your prescription carefully.   For the next 24 hours you can use MyChart to ask questions about today's visit, request a non-urgent call back, or ask for a work or school excuse. You will get an email in the next two days asking about your experience. I hope that your e-visit has been valuable and will speed your recovery.

## 2023-11-04 ENCOUNTER — Other Ambulatory Visit: Payer: Self-pay

## 2023-11-10 ENCOUNTER — Other Ambulatory Visit: Payer: Self-pay

## 2023-11-10 MED ORDER — ESTRADIOL 0.1 MG/24HR TD PTTW
2.0000 | MEDICATED_PATCH | TRANSDERMAL | 6 refills | Status: AC
  Filled 2023-11-10: qty 24, 42d supply, fill #0
  Filled 2024-02-04: qty 24, 84d supply, fill #0
  Filled 2024-02-04: qty 24, 42d supply, fill #0
  Filled 2024-02-06: qty 24, 84d supply, fill #0

## 2023-11-10 MED ORDER — ESTRADIOL 0.1 MG/24HR TD PTTW: 2.0000 | MEDICATED_PATCH | TRANSDERMAL | 6 refills | Status: AC

## 2023-11-11 ENCOUNTER — Other Ambulatory Visit: Payer: Self-pay

## 2023-11-12 ENCOUNTER — Other Ambulatory Visit: Payer: Self-pay

## 2023-11-12 MED ORDER — ESTRADIOL 0.1 MG/24HR TD PTTW
1.0000 | MEDICATED_PATCH | TRANSDERMAL | 10 refills | Status: AC
  Filled 2023-11-24: qty 24, 84d supply, fill #0
  Filled 2023-11-24: qty 8, 28d supply, fill #0

## 2023-11-17 DIAGNOSIS — R1310 Dysphagia, unspecified: Secondary | ICD-10-CM | POA: Diagnosis not present

## 2023-11-17 DIAGNOSIS — R140 Abdominal distension (gaseous): Secondary | ICD-10-CM | POA: Diagnosis not present

## 2023-11-17 DIAGNOSIS — K219 Gastro-esophageal reflux disease without esophagitis: Secondary | ICD-10-CM | POA: Diagnosis not present

## 2023-11-20 ENCOUNTER — Other Ambulatory Visit
Admission: RE | Admit: 2023-11-20 | Discharge: 2023-11-20 | Disposition: A | Discharge status: Home / Self Care | Source: Ambulatory Visit | Attending: Obstetrics and Gynecology | Admitting: Obstetrics and Gynecology

## 2023-11-20 DIAGNOSIS — Z0000 Encounter for general adult medical examination without abnormal findings: Secondary | ICD-10-CM | POA: Diagnosis not present

## 2023-11-20 DIAGNOSIS — R140 Abdominal distension (gaseous): Secondary | ICD-10-CM | POA: Diagnosis not present

## 2023-11-20 DIAGNOSIS — E538 Deficiency of other specified B group vitamins: Secondary | ICD-10-CM | POA: Diagnosis not present

## 2023-11-20 DIAGNOSIS — N951 Menopausal and female climacteric states: Secondary | ICD-10-CM | POA: Insufficient documentation

## 2023-11-20 DIAGNOSIS — E559 Vitamin D deficiency, unspecified: Secondary | ICD-10-CM | POA: Diagnosis not present

## 2023-11-21 ENCOUNTER — Other Ambulatory Visit: Payer: Self-pay

## 2023-11-21 LAB — TESTOSTERONE,FREE AND TOTAL
Testosterone, Free: 0.4 pg/mL (ref 0.0–4.2)
Testosterone: 4 ng/dL (ref 4–50)

## 2023-11-24 ENCOUNTER — Other Ambulatory Visit: Payer: Self-pay

## 2023-11-24 MED ORDER — ESTRADIOL 0.01 % VA CREA
0.5000 g | TOPICAL_CREAM | VAGINAL | 4 refills | Status: AC
  Filled 2023-11-24: qty 42.5, 30d supply, fill #0

## 2023-12-04 ENCOUNTER — Encounter: Payer: Self-pay | Admitting: Pharmacist

## 2023-12-04 ENCOUNTER — Other Ambulatory Visit: Payer: Self-pay

## 2023-12-04 MED ORDER — TESTOSTERONE POWD: 0 refills | Status: AC

## 2023-12-05 ENCOUNTER — Ambulatory Visit
Admission: RE | Admit: 2023-12-05 | Discharge: 2023-12-05 | Disposition: A | Discharge status: Home / Self Care | Attending: Gastroenterology | Admitting: Gastroenterology

## 2023-12-05 ENCOUNTER — Ambulatory Visit: Admitting: Anesthesiology

## 2023-12-05 ENCOUNTER — Encounter
Admission: RE | Disposition: A | Discharge status: Home / Self Care | Payer: Self-pay | Source: Home / Self Care | Attending: Gastroenterology

## 2023-12-05 ENCOUNTER — Encounter: Payer: Self-pay | Admitting: Gastroenterology

## 2023-12-05 DIAGNOSIS — K2970 Gastritis, unspecified, without bleeding: Secondary | ICD-10-CM | POA: Diagnosis not present

## 2023-12-05 DIAGNOSIS — K2950 Unspecified chronic gastritis without bleeding: Secondary | ICD-10-CM | POA: Insufficient documentation

## 2023-12-05 DIAGNOSIS — F418 Other specified anxiety disorders: Secondary | ICD-10-CM | POA: Diagnosis not present

## 2023-12-05 DIAGNOSIS — K219 Gastro-esophageal reflux disease without esophagitis: Secondary | ICD-10-CM | POA: Diagnosis not present

## 2023-12-05 DIAGNOSIS — R1310 Dysphagia, unspecified: Secondary | ICD-10-CM | POA: Diagnosis not present

## 2023-12-05 DIAGNOSIS — M797 Fibromyalgia: Secondary | ICD-10-CM | POA: Insufficient documentation

## 2023-12-05 DIAGNOSIS — I479 Paroxysmal tachycardia, unspecified: Secondary | ICD-10-CM | POA: Diagnosis not present

## 2023-12-05 DIAGNOSIS — K259 Gastric ulcer, unspecified as acute or chronic, without hemorrhage or perforation: Secondary | ICD-10-CM | POA: Diagnosis not present

## 2023-12-05 HISTORY — PX: ESOPHAGOGASTRODUODENOSCOPY: SHX5428

## 2023-12-05 SURGERY — EGD (ESOPHAGOGASTRODUODENOSCOPY)
Anesthesia: General

## 2023-12-05 MED ORDER — SODIUM CHLORIDE 0.9 % IV SOLN: INTRAVENOUS | Status: DC

## 2023-12-05 MED ORDER — PROPOFOL 10 MG/ML IV BOLUS
INTRAVENOUS | Status: DC | PRN
  Administered 2023-12-05: 100 mg via INTRAVENOUS
  Administered 2023-12-05 (×2): 40 mg via INTRAVENOUS

## 2023-12-05 MED ORDER — LIDOCAINE HCL (CARDIAC) PF 100 MG/5ML IV SOSY
PREFILLED_SYRINGE | INTRAVENOUS | Status: DC | PRN
  Administered 2023-12-05: 100 mg via INTRAVENOUS

## 2023-12-05 NOTE — Anesthesia Postprocedure Evaluation (Signed)
 Anesthesia Post Note  Patient: Jaime Fletcher  Procedure(s) Performed: EGD (ESOPHAGOGASTRODUODENOSCOPY)  Patient location during evaluation: Endoscopy Anesthesia Type: General Level of consciousness: awake and alert Pain management: pain level controlled Vital Signs Assessment: post-procedure vital signs reviewed and stable Respiratory status: spontaneous breathing, nonlabored ventilation, respiratory function stable and patient connected to nasal cannula oxygen Cardiovascular status: blood pressure returned to baseline and stable Postop Assessment: no apparent nausea or vomiting Anesthetic complications: no   No notable events documented.   Last Vitals:  Vitals:   12/05/23 0949 12/05/23 0959  BP: 114/69 (!) 119/52  Pulse:  77  Resp: 17 17  Temp:    SpO2: 98% 100%    Last Pain:  Vitals:   12/05/23 0959  TempSrc:   PainSc: 0-No pain                 [name]

## 2023-12-05 NOTE — Anesthesia Procedure Notes (Signed)
 Procedure Name: MAC Date/Time: 12/05/2023 9:25 AM  Performed by: Lorrene Camelia LABOR, CRNAPre-anesthesia Checklist: Patient identified, Emergency Drugs available, Suction available and Patient being monitored Patient Re-evaluated:Patient Re-evaluated prior to induction Oxygen Delivery Method: Simple face mask Preoxygenation: Pre-oxygenation with 100% oxygen Induction Type: IV induction Comments: POM

## 2023-12-05 NOTE — Anesthesia Preprocedure Evaluation (Addendum)
 Anesthesia Evaluation  Patient identified by MRN, date of birth, ID band Patient awake    Reviewed: Allergy & Precautions, NPO status , Patient's Chart, lab work & pertinent test results  History of Anesthesia Complications Negative for: history of anesthetic complications  Airway Mallampati: I  TM Distance: >3 FB Neck ROM: full    Dental no notable dental hx.    Pulmonary neg pulmonary ROS   Pulmonary exam normal        Cardiovascular negative cardio ROS Normal cardiovascular exam     Neuro/Psych  Headaches PSYCHIATRIC DISORDERS Anxiety Depression       GI/Hepatic Neg liver ROS,GERD  Medicated,,  Endo/Other  negative endocrine ROS    Renal/GU negative Renal ROS  negative genitourinary   Musculoskeletal  (+)  Fibromyalgia -  Abdominal   Peds  Hematology negative hematology ROS (+)   Anesthesia Other Findings Past Medical History: No date: Abnormal Pap smear of cervix     Comment:  dysplasia No date: ADD (attention deficit disorder) No date: B12 deficiency No date: Fibroid No date: GERD (gastroesophageal reflux disease) No date: Migraine headache No date: Pneumonia No date: PTSD (post-traumatic stress disorder) No date: PTSD (post-traumatic stress disorder)  Past Surgical History: 01/31/2020: ABDOMINAL HYSTERECTOMY No date: APPENDECTOMY 2005: AUGMENTATION MAMMAPLASTY; Bilateral No date: cold knife bx 01/07/2022: COLONOSCOPY WITH PROPOFOL ; N/A     Comment:  Procedure: COLONOSCOPY WITH PROPOFOL ;  Surgeon: Therisa Bi, MD;  Location: [facility] ENDOSCOPY;  Service:               Gastroenterology;  Laterality: N/A; No date: DILATION AND CURETTAGE OF UTERUS 01/31/2020: LAPAROSCOPIC VAGINAL HYSTERECTOMY WITH SALPINGECTOMY;  Bilateral     Comment:  Procedure: LAPAROSCOPIC ASSISTED VAGINAL HYSTERECTOMY               WITH BILATERAL SALPINGECTOMY IUD REMOVAL;  Surgeon: Marget Lenis, MD;   Location: [facility];                Service: Gynecology;  Laterality: Bilateral; No date: SPINE SURGERY     Comment:  C3-4 fusion No date: TONSILLECTOMY  BMI    Body Mass Index: 25.51 kg/m      Reproductive/Obstetrics negative OB ROS                              Anesthesia Physical Anesthesia Plan  ASA: 2  Anesthesia Plan: General   Post-op Pain Management: Minimal or no pain anticipated   Induction: Intravenous  PONV Risk Score and Plan: 2 and Propofol  infusion and TIVA  Airway Management Planned: Natural Airway and Nasal Cannula  Additional Equipment:   Intra-op Plan:   Post-operative Plan:   Informed Consent: I have reviewed the patients History and Physical, chart, labs and discussed the procedure including the risks, benefits and alternatives for the proposed anesthesia with the patient or authorized representative who has indicated his/her understanding and acceptance.     Dental Advisory Given  Plan Discussed with: Anesthesiologist, CRNA and Surgeon  Anesthesia Plan Comments: (Patient consented for risks of anesthesia including but not limited to:  - adverse reactions to medications - risk of airway placement if required - damage to eyes, teeth, lips or other oral mucosa - nerve damage due to positioning  - sore throat or hoarseness -  Damage to heart, brain, nerves, lungs, other parts of body or loss of life  Patient voiced understanding and assent.)         Anesthesia Quick Evaluation

## 2023-12-05 NOTE — Transfer of Care (Signed)
 Immediate Anesthesia Transfer of Care Note  Patient: Jaime Fletcher  Procedure(s) Performed: EGD (ESOPHAGOGASTRODUODENOSCOPY)  Patient Location: Endoscopy Unit  Anesthesia Type:General  Level of Consciousness: drowsy and patient cooperative  Airway & Oxygen Therapy: Patient Spontanous Breathing  Post-op Assessment: Report given to RN and Post -op Vital signs reviewed and stable  Post vital signs: Reviewed and stable  Last Vitals:  Vitals Value Taken Time  BP 106/58 12/05/23 09:40  Temp 37 C 12/05/23 09:40  Pulse 97 12/05/23 09:40  Resp 21 12/05/23 09:40  SpO2 97 % 12/05/23 09:40  Vitals shown include unfiled device data.  Last Pain:  Vitals:   12/05/23 0939  TempSrc:   PainSc: 0-No pain         Complications: No notable events documented.

## 2023-12-05 NOTE — H&P (Signed)
 Ruel Kung , MD [address], [address], [place], [place], [address] Phone: [phone] Fax: [phone]  Primary Care Physician:  Jeffie Cheryl BRAVO, MD   Pre-Procedure History & Physical: HPI:  Jaime Fletcher is a 53 y.o. female is here for an endoscopy    Past Medical History:  Diagnosis Date   Abnormal Pap smear of cervix    dysplasia   ADD (attention deficit disorder)    B12 deficiency    Fibroid    GERD (gastroesophageal reflux disease)    Migraine headache    Pneumonia    PTSD (post-traumatic stress disorder)    PTSD (post-traumatic stress disorder)     Past Surgical History:  Procedure Laterality Date   ABDOMINAL HYSTERECTOMY  01/31/2020   APPENDECTOMY     AUGMENTATION MAMMAPLASTY Bilateral 2005   cold knife bx     COLONOSCOPY WITH PROPOFOL  N/A 01/07/2022   Procedure: COLONOSCOPY WITH PROPOFOL ;  Surgeon: Kung Ruel, MD;  Location: [facility] ENDOSCOPY;  Service: Gastroenterology;  Laterality: N/A;   DILATION AND CURETTAGE OF UTERUS     LAPAROSCOPIC VAGINAL HYSTERECTOMY WITH SALPINGECTOMY Bilateral 01/31/2020   Procedure: LAPAROSCOPIC ASSISTED VAGINAL HYSTERECTOMY WITH BILATERAL SALPINGECTOMY IUD REMOVAL;  Surgeon: Marget Lenis, MD;  Location: [facility];  Service: Gynecology;  Laterality: Bilateral;   SPINE SURGERY     C3-4 fusion   TONSILLECTOMY      Prior to Admission medications   Medication Sig Start Date End Date Taking? Authorizing Provider  Aspirin-Acetaminophen  (GOODYS BACK & BODY PAIN) 500-325 MG PACK Take by mouth.   Yes [provider]  estradiol  (DOTTI ) 0.1 MG/24HR patch Place 1 patch (0.1 mg total) onto the skin 2 (two) times a week. 01/28/22  Yes   famotidine (PEPCID) 10 MG tablet Take 10 mg by mouth 2 (two) times daily.   Yes [provider]  phentermine  (ADIPEX-P ) 37.5 MG tablet Take 1 tablet (37.5 mg total) by mouth in the morning. 10/23/23  Yes   propranolol  (INDERAL ) 10 MG tablet Take 1 tablet (10 mg total)  by mouth daily as needed. 09/11/21  Yes Gollan, Timothy J, MD  cetirizine  (ZYRTEC ) 10 MG tablet Take 1 tablet (10 mg total) by mouth once daily 04/16/22     estradiol  (DOTTI ) 0.1 MG/24HR patch Place 2 patches (0.2 mg total) onto the skin 2 (two) times a week. 10/23/23     estradiol  (DOTTI ) 0.1 MG/24HR patch Place 2 patchs onto the skin twice a week as directed 11/10/23     estradiol  (DOTTI ) 0.1 MG/24HR patch Place 2 patchs onto the skin twice a week as directed 11/10/23     estradiol  (DOTTI ) 0.1 MG/24HR patch Place 1 patch (0.1 mg total) onto the skin 2 (two) times a week as directed. 11/13/23     estradiol  (ESTRACE ) 0.01 % CREA vaginal cream Insert 0.5 g twice a week by vaginal route at bedtime. 11/24/23     fluticasone  (FLONASE ) 50 MCG/ACT nasal spray INSTILL 2 SPRAYS INTO EACH NOSTRIL DAILYAT BEDTIME Patient taking differently: Place 2 sprays into both nostrils daily as needed (allergies.). 11/23/19   Joshua Cathryne BROCKS, MD  fluticasone  (FLONASE ) 50 MCG/ACT nasal spray Place 2 sprays into both nostrils daily. 07/11/22     hydrOXYzine  (VISTARIL ) 25 MG capsule Take 1 capsule (25 mg total) by mouth every 8 (eight) hours as needed. 11/03/23   Gladis Elsie BROCKS, PA-C  ibuprofen  (ADVIL ) 600 MG tablet Take 1 tablet (600 mg total) by mouth every 6 (six) hours  as needed for mild pain or moderate pain. 02/01/20   Marget Lenis, MD  MAGNESIUM PO Take 325 mg by mouth every evening. Powder    [provider]  Olopatadine  HCl 0.2 % SOLN Place 1 drop into both eyes once daily 04/16/22     pantoprazole  (PROTONIX ) 40 MG tablet Take 1 tablet (40 mg total) by mouth once daily Patient not taking: Reported on 12/05/2023 12/18/21     Probiotic Product (PROBIOTIC PEARLS) CAPS Take by mouth.    [provider]  progesterone  (PROMETRIUM ) 100 MG capsule Take 1 capsule (100 mg total) by mouth at bedtime. 02/25/22     progesterone  (PROMETRIUM ) 100 MG capsule Take 1 capsule (100 mg total) by mouth at bedtime. 10/23/23      Testosterone POWD We do not make this compound. Mychart message to patient 12/04/23 kjn 12/04/23     triamcinolone  (KENALOG ) 0.025 % ointment Apply 1 Application topically 2 (two) times daily. 11/03/23   Moishe Chiquita HERO, NP  Vitamin D , Cholecalciferol, 25 MCG (1000 UT) CAPS Take 1,000 Units by mouth daily.    [provider]    Allergies as of 11/19/2023 - Review Complete 11/03/2023  Allergen Reaction Noted   Hydrocodone-acetaminophen  Nausea And Vomiting 07/17/2012   Prednisone Other (See Comments) 09/14/2010   Amitriptyline Other (See Comments) 09/14/2010    Family History  Problem Relation Age of Onset   Diabetes Mother    Heart attack Mother        x2   Heart disease Mother    Hypertension Mother    Atrial fibrillation Father    Hypertension Father    Breast cancer Paternal Aunt    Heart failure Maternal Grandmother    Ovarian cancer Neg Hx    Colon cancer Neg Hx     Social History   Socioeconomic History   Marital status: Single    Spouse name: Not on file   Number of children: Not on file   Years of education: Not on file   Highest education level: Not on file  Occupational History   Not on file  Tobacco Use   Smoking status: Never   Smokeless tobacco: Never  Vaping Use   Vaping status: Never Used  Substance and Sexual Activity   Alcohol use: Yes    Comment: social   Drug use: No   Sexual activity: Yes    Birth control/protection: I.U.D.  Other Topics Concern   Not on file  Social History Narrative   Not on file   Social Drivers of Health   Financial Resource Strain: Low Risk  (11/17/2023)   Received from [facility]   Overall Financial Resource Strain (CARDIA)    Difficulty of Paying Living Expenses: Not hard at all  Food Insecurity: No Food Insecurity (11/17/2023)   Received from [facility]   Hunger Vital Sign    Within the past 12 months, you worried that your food would run out before you got the money  to buy more.: Never true    Within the past 12 months, the food you bought just didn't last and you didn't have money to get more.: Never true  Transportation Needs: No Transportation Needs (11/17/2023)   Received from [facility] - Transportation    In the past 12 months, has lack of transportation kept you from medical appointments or from getting medications?: No    Lack of Transportation (Non-Medical): No  Physical Activity: Insufficiently  Active (12/17/2017)   Exercise Vital Sign    Days of Exercise per Week: 2 days    Minutes of Exercise per Session: 30 min  Stress: Not on file  Social Connections: Not on file  Intimate Partner Violence: Not on file    Review of Systems: See HPI, otherwise negative ROS  Physical Exam: BP (!) 109/51   Pulse 86   Temp (!) 96.4 F (35.8 C) (Temporal)   Resp 16   Wt 61.2 kg   LMP 01/20/2020   SpO2 98%   BMI 25.51 kg/m  General:   Alert,  pleasant and cooperative in NAD Head:  Normocephalic and atraumatic. Neck:  Supple; no masses or thyromegaly. Lungs:  Clear throughout to auscultation, normal respiratory effort.    Heart:  +S1, +S2, Regular rate and rhythm, No edema. Abdomen:  Soft, nontender and nondistended. Normal bowel sounds, without guarding, and without rebound.   Neurologic:  Alert and  oriented x4;  grossly normal neurologically.  Impression/Plan: Norberta D Crafton is here for an endoscopy  to be performed for  evaluation of dysphagia    Risks, benefits, limitations, and alternatives regarding endoscopy have been reviewed with the patient.  Questions have been answered.  All parties agreeable.   Ruel Kung, MD  12/05/2023, 8:23 AM

## 2023-12-05 NOTE — Op Note (Signed)
 [facility] Gastroenterology Patient Name: Jaime Fletcher Procedure Date: 12/05/2023 9:24 AM MRN: [id number] Account #: [id number] Date of Birth: Dec 06, 1970 Admit Type: Outpatient Age: 53 Room: [facility] ENDO ROOM 4 Gender: Female Note Status: Finalized Instrument Name: Upper GI Scope [id number] Procedure:             Upper GI endoscopy Indications:           Dysphagia Providers:             Ruel Kung MD, MD Referring MD:          Ruel Kung MD, MD (Referring MD), [name]                         (Referring MD) Medicines:             Monitored Anesthesia Care Complications:         No immediate complications. Procedure:             Pre-Anesthesia Assessment:                        - Prior to the procedure, a History and Physical was                         performed, and patient medications, allergies and                         sensitivities were reviewed. The patient's tolerance                         of previous anesthesia was reviewed.                        - The risks and benefits of the procedure and the                         sedation options and risks were discussed with the                         patient. All questions were answered and informed                         consent was obtained.                        - ASA Grade Assessment: II - A patient with mild                         systemic disease.                        After obtaining informed consent, the endoscope was                         passed under direct vision. Throughout the procedure,                         the patient's blood pressure, pulse, and oxygen                         saturations were  monitored continuously. The Endoscope                         was introduced through the mouth, and advanced to the                         third part of duodenum. The upper GI endoscopy was                         accomplished with ease. The patient tolerated the                          procedure well. Findings:      The examined duodenum was normal.      Three non-bleeding linear gastric ulcers with a clean ulcer base       (Forrest Class III) were found on the greater curvature of the stomach       and at the incisura. The largest lesion was 10 mm in largest dimension.       Biopsies were taken with a cold forceps for histology.      The examined esophagus was normal. Biopsies were taken with a cold       forceps for histology.      The cardia and gastric fundus were normal on retroflexion. Impression:            - Normal examined duodenum.                        - Non-bleeding gastric ulcers with a clean ulcer base                         (Forrest Class III). Biopsied.                        - Normal esophagus. Biopsied. Recommendation:        - Await pathology results.                        - Discharge patient to home (with escort).                        - Resume previous diet.                        - Continue present medications.                        - Return to my office as previously scheduled. Procedure Code(s):     --- Professional ---                        [phone], Esophagogastroduodenoscopy, flexible,                         transoral; with biopsy, single or multiple Diagnosis Code(s):     --- Professional ---                        K25.9, Gastric ulcer, unspecified as acute or chronic,  without hemorrhage or perforation                        R13.10, Dysphagia, unspecified CPT copyright 2022 American Medical Association. All rights reserved. The codes documented in this report are preliminary and upon coder review may  be revised to meet current compliance requirements. Ruel Kung, MD Ruel Kung MD, MD 12/05/2023 9:37:00 AM This report has been signed electronically. Number of Addenda: 0 Note Initiated On: 12/05/2023 9:24 AM Estimated Blood Loss:  Estimated blood loss: none.      [facility]

## 2023-12-08 LAB — SURGICAL PATHOLOGY

## 2023-12-17 ENCOUNTER — Other Ambulatory Visit: Payer: Self-pay

## 2023-12-17 MED ORDER — OMEPRAZOLE 40 MG PO CPDR
40.0000 mg | DELAYED_RELEASE_CAPSULE | Freq: Every day | ORAL | 3 refills | Status: AC
  Filled 2023-12-17: qty 90, 90d supply, fill #0

## 2023-12-18 ENCOUNTER — Other Ambulatory Visit: Payer: Self-pay

## 2023-12-19 ENCOUNTER — Other Ambulatory Visit: Payer: Self-pay

## 2023-12-22 ENCOUNTER — Other Ambulatory Visit: Payer: Self-pay

## 2023-12-23 ENCOUNTER — Other Ambulatory Visit: Payer: Self-pay

## 2023-12-24 ENCOUNTER — Other Ambulatory Visit: Payer: Self-pay

## 2023-12-24 ENCOUNTER — Other Ambulatory Visit: Payer: Self-pay | Admitting: Cardiovascular Disease

## 2023-12-24 MED ORDER — PROPRANOLOL HCL 10 MG PO TABS
10.0000 mg | ORAL_TABLET | Freq: Every day | ORAL | 0 refills | Status: AC | PRN
  Filled 2023-12-24: qty 30, 30d supply, fill #0

## 2023-12-25 ENCOUNTER — Other Ambulatory Visit: Payer: Self-pay

## 2023-12-25 MED ORDER — PROPRANOLOL HCL 10 MG PO TABS
10.0000 mg | ORAL_TABLET | Freq: Every day | ORAL | 1 refills | Status: AC | PRN
Start: 2023-12-24 — End: ?
  Filled 2023-12-25: qty 30, 30d supply, fill #0

## 2023-12-31 ENCOUNTER — Other Ambulatory Visit: Payer: Self-pay

## 2024-01-01 IMAGING — MG MM  DIGITAL DIAGNOSTIC BREAST BILAT IMPLANT W/ TOMO W/ CAD
8 of 14 series · 8 of 34 positions shown · non-contrast
Comparison: Prior films

CLINICAL DATA: Bilateral nipple pain.

EXAM:
DIGITAL DIAGNOSTIC BILATERAL MAMMOGRAM WITH IMPLANTS, CAD AND
TOMOSYNTHESIS; ULTRASOUND LEFT BREAST LIMITED; ULTRASOUND RIGHT
BREAST LIMITED
TECHNIQUE: Bilateral digital diagnostic mammography and breast tomosynthesis
was performed. The images were evaluated with computer-aided
detection. Standard and/or implant displaced views were performed.;
Targeted ultrasound examination of the left breast was performed.;
Targeted ultrasound examination of the right breast was performed

[R CC]
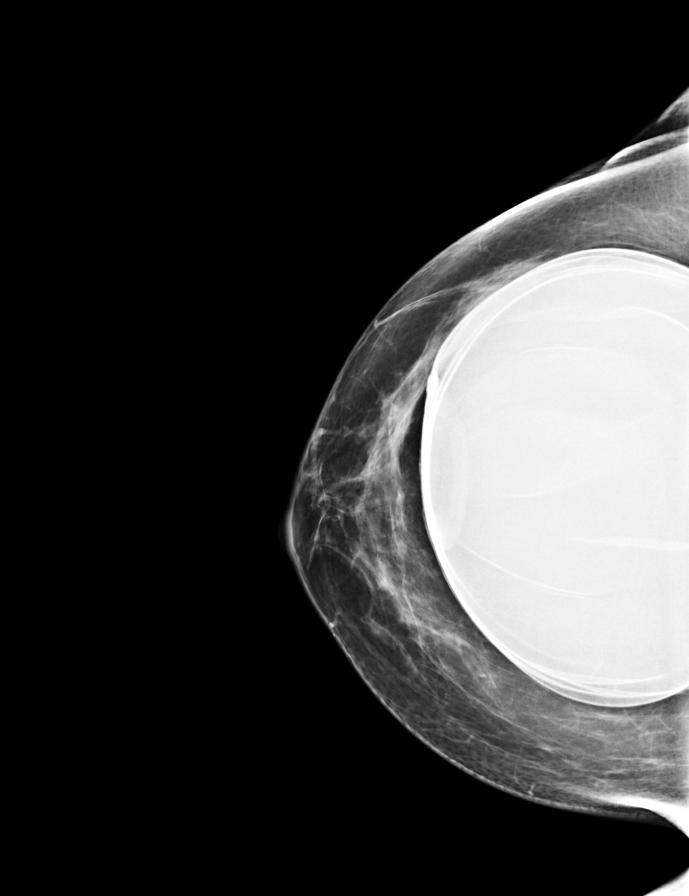

[L CC]
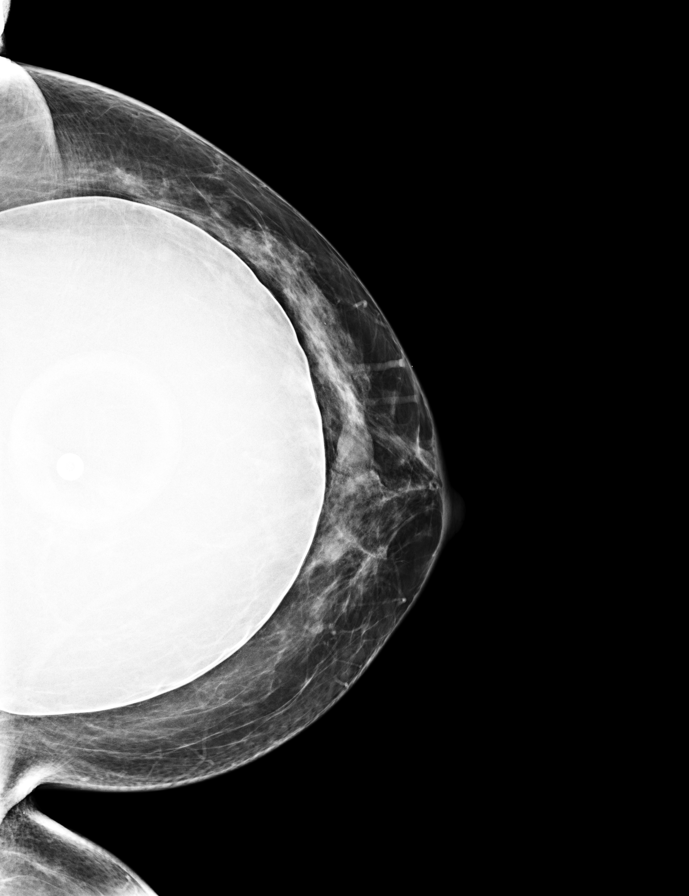

[R MLO]
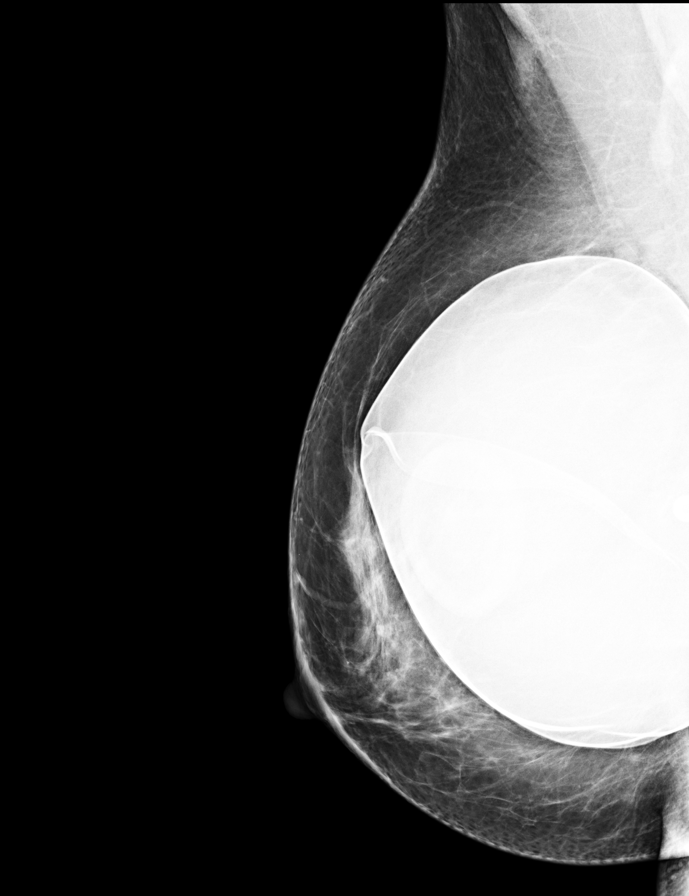

[L MLO]
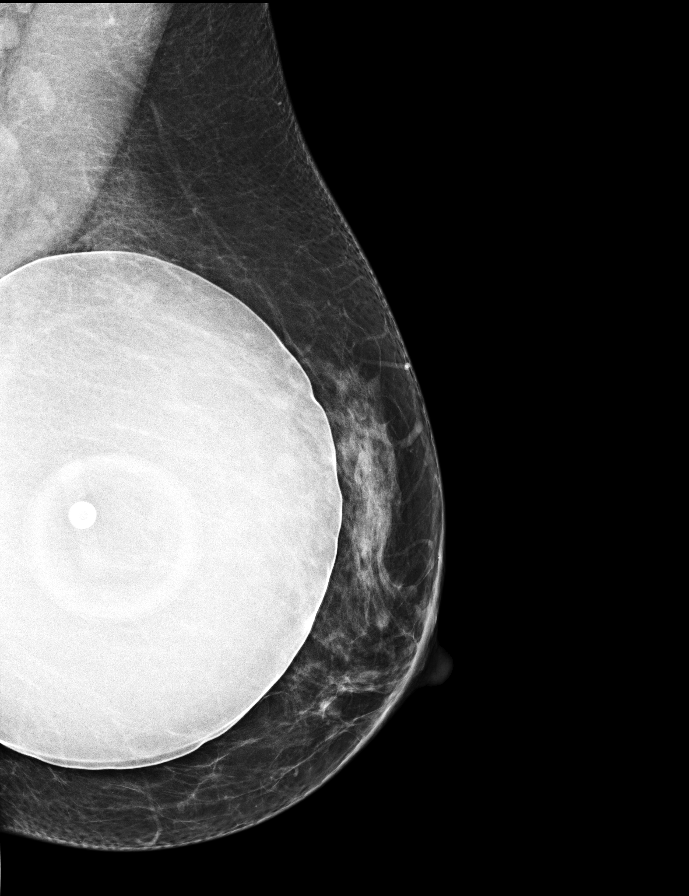

[R CC synth-2D]
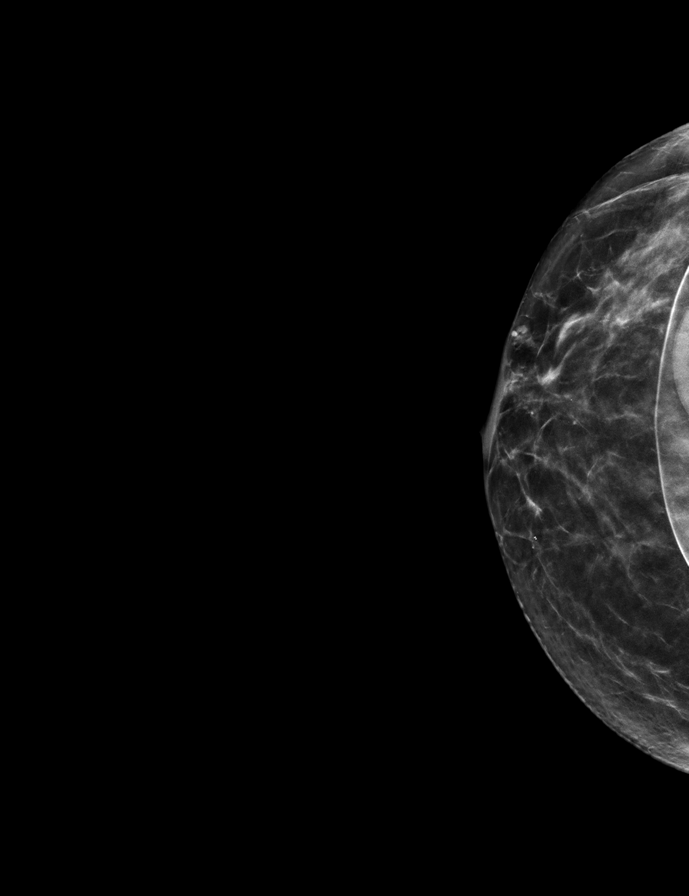

[L CC synth-2D]
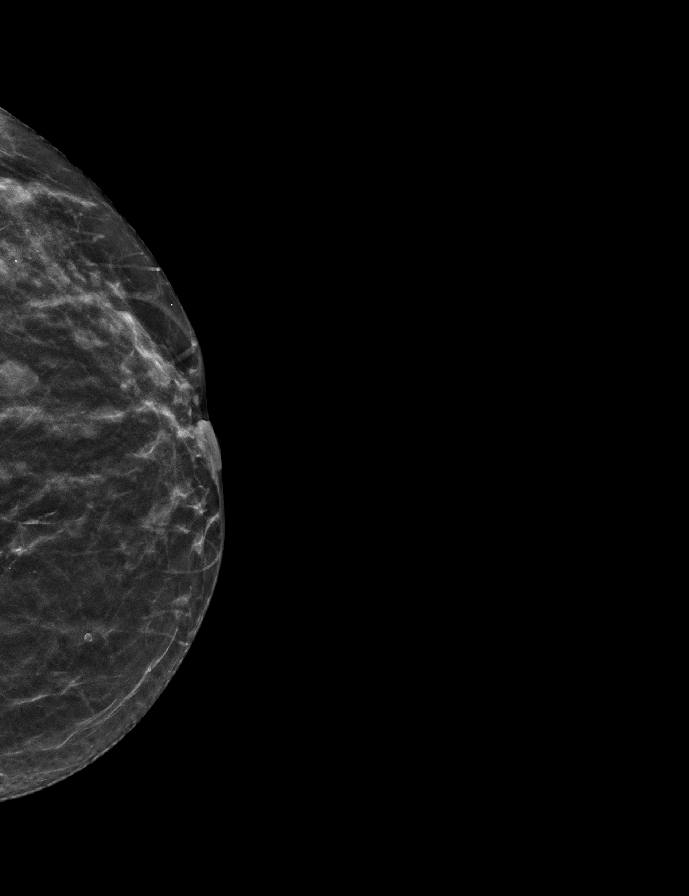

[L MLO synth-2D]
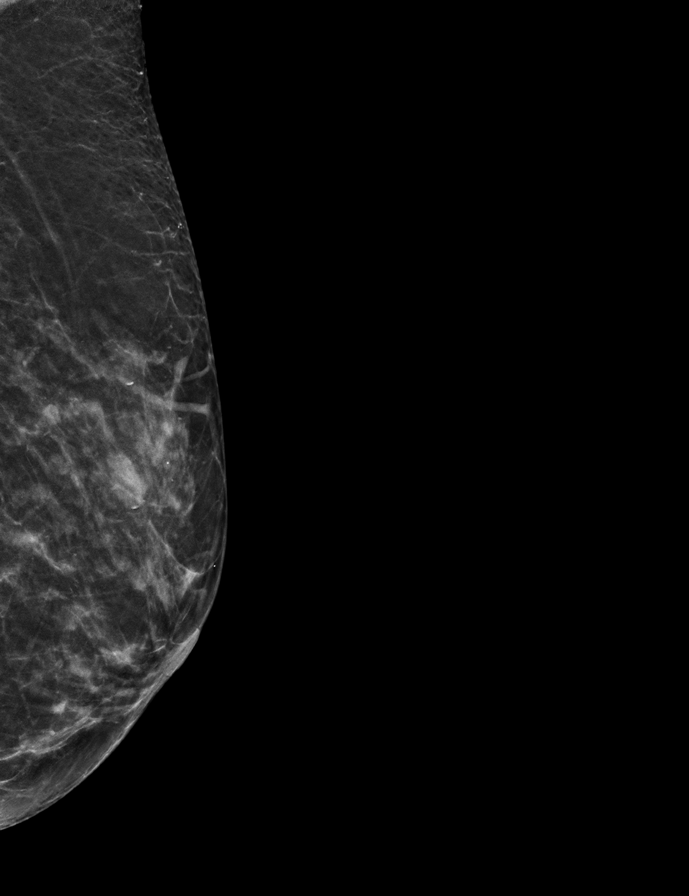

[R MLO synth-2D]
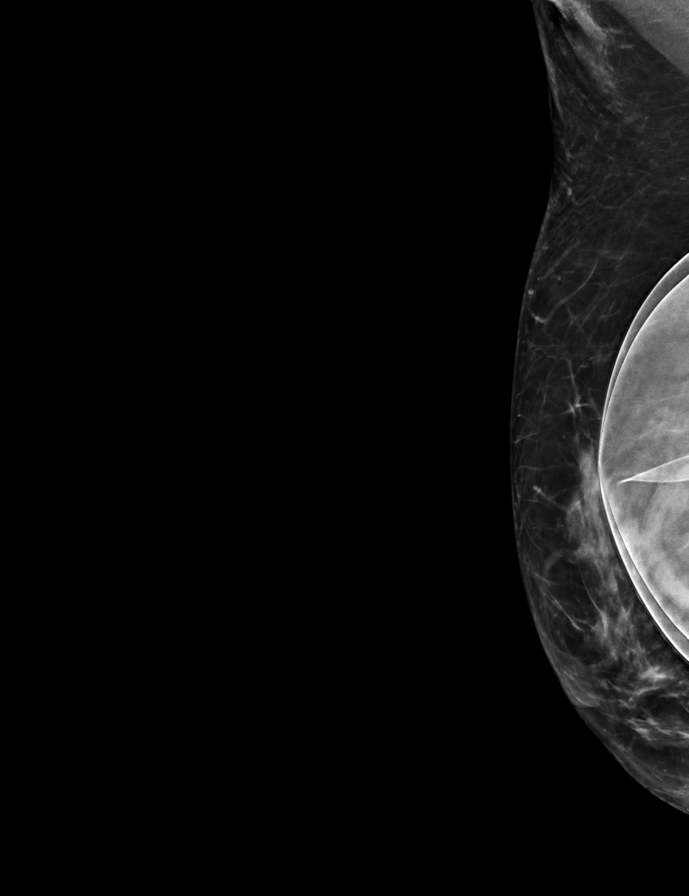

[8 of 34 positions shown; findings below may reference images not displayed]

ACR Breast Density Category b: There are scattered areas of
fibroglandular density.
FINDINGS: Cc and MLO views of bilateral breasts are submitted. There is a mass
in the left breast upper left breast. The right breast is negative.
The patient has implants.

Targeted ultrasound is performed, showing 1.2 x 0.7 x 1 cm simple
cyst at the left breast 12 o'clock 2 cm from nipple correlating to
the mammographic mass. Ultrasound of the bilateral retroareolar
breasts demonstrate no focal abnormal discrete cystic or solid
lesion.
IMPRESSION: Benign findings.

RECOMMENDATION:
Routine screening mammogram in 1 year.

I have discussed the findings and recommendations with the patient.
If applicable, a reminder letter will be sent to the patient
regarding the next appointment.

BI-RADS CATEGORY  2: Benign.

## 2024-01-02 ENCOUNTER — Other Ambulatory Visit: Payer: Self-pay

## 2024-01-02 MED ORDER — PROPRANOLOL HCL 10 MG PO TABS
10.0000 mg | ORAL_TABLET | Freq: Every day | ORAL | 0 refills | Status: AC | PRN
  Filled 2024-01-02 – 2024-01-16 (×2): qty 90, 90d supply, fill #0

## 2024-01-05 ENCOUNTER — Other Ambulatory Visit: Payer: Self-pay

## 2024-01-15 ENCOUNTER — Other Ambulatory Visit: Payer: Self-pay

## 2024-01-15 MED ORDER — TETRACYCLINE HCL 500 MG PO CAPS
500.0000 mg | ORAL_CAPSULE | Freq: Four times a day (QID) | ORAL | 0 refills | Status: AC
  Filled 2024-01-15 (×2): qty 56, 14d supply, fill #0

## 2024-01-15 MED ORDER — METRONIDAZOLE 500 MG PO TABS
500.0000 mg | ORAL_TABLET | Freq: Three times a day (TID) | ORAL | 0 refills | Status: AC
  Filled 2024-01-15: qty 42, 14d supply, fill #0

## 2024-01-15 MED ORDER — OMEPRAZOLE 40 MG PO CPDR
40.0000 mg | DELAYED_RELEASE_CAPSULE | Freq: Two times a day (BID) | ORAL | 0 refills | Status: AC
  Filled 2024-02-04: qty 28, 14d supply, fill #0

## 2024-01-15 MED ORDER — BISMUTH SUBSALICYLATE 262 MG PO CHEW
CHEWABLE_TABLET | Freq: Four times a day (QID) | ORAL | 0 refills | Status: AC
  Filled 2024-01-15: qty 30, 14d supply, fill #0

## 2024-01-15 MED ORDER — DOXYCYCLINE MONOHYDRATE 100 MG PO CAPS
100.0000 mg | ORAL_CAPSULE | Freq: Two times a day (BID) | ORAL | 0 refills | Status: AC
  Filled 2024-01-15: qty 28, 14d supply, fill #0

## 2024-01-16 ENCOUNTER — Other Ambulatory Visit: Payer: Self-pay

## 2024-01-28 DIAGNOSIS — T50905A Adverse effect of unspecified drugs, medicaments and biological substances, initial encounter: Secondary | ICD-10-CM | POA: Diagnosis not present

## 2024-01-28 DIAGNOSIS — R140 Abdominal distension (gaseous): Secondary | ICD-10-CM | POA: Diagnosis not present

## 2024-01-28 DIAGNOSIS — B9681 Helicobacter pylori [H. pylori] as the cause of diseases classified elsewhere: Secondary | ICD-10-CM | POA: Diagnosis not present

## 2024-01-28 DIAGNOSIS — K2970 Gastritis, unspecified, without bleeding: Secondary | ICD-10-CM | POA: Diagnosis not present

## 2024-01-28 DIAGNOSIS — K219 Gastro-esophageal reflux disease without esophagitis: Secondary | ICD-10-CM | POA: Diagnosis not present

## 2024-02-04 ENCOUNTER — Other Ambulatory Visit: Payer: Self-pay

## 2024-02-06 ENCOUNTER — Other Ambulatory Visit: Payer: Self-pay

## 2024-02-24 ENCOUNTER — Other Ambulatory Visit: Payer: Self-pay | Admitting: Medical Genetics

## 2024-02-25 ENCOUNTER — Other Ambulatory Visit
Admission: RE | Admit: 2024-02-25 | Discharge: 2024-02-25 | Disposition: A | Discharge status: Home / Self Care | Payer: Self-pay | Source: Ambulatory Visit | Attending: Medical Genetics | Admitting: Medical Genetics

## 2024-03-02 ENCOUNTER — Ambulatory Visit

## 2024-03-02 DIAGNOSIS — L719 Rosacea, unspecified: Secondary | ICD-10-CM

## 2024-03-02 DIAGNOSIS — L578 Other skin changes due to chronic exposure to nonionizing radiation: Secondary | ICD-10-CM | POA: Diagnosis not present

## 2024-03-02 DIAGNOSIS — L603 Nail dystrophy: Secondary | ICD-10-CM | POA: Diagnosis not present

## 2024-03-02 DIAGNOSIS — Z86018 Personal history of other benign neoplasm: Secondary | ICD-10-CM | POA: Diagnosis not present

## 2024-03-02 DIAGNOSIS — L858 Other specified epidermal thickening: Secondary | ICD-10-CM

## 2024-03-02 DIAGNOSIS — Z1283 Encounter for screening for malignant neoplasm of skin: Secondary | ICD-10-CM

## 2024-03-02 DIAGNOSIS — D229 Melanocytic nevi, unspecified: Secondary | ICD-10-CM

## 2024-03-02 DIAGNOSIS — D1801 Hemangioma of skin and subcutaneous tissue: Secondary | ICD-10-CM

## 2024-03-02 DIAGNOSIS — I781 Nevus, non-neoplastic: Secondary | ICD-10-CM

## 2024-03-02 DIAGNOSIS — L814 Other melanin hyperpigmentation: Secondary | ICD-10-CM | POA: Diagnosis not present

## 2024-03-02 DIAGNOSIS — W908XXA Exposure to other nonionizing radiation, initial encounter: Secondary | ICD-10-CM

## 2024-03-02 DIAGNOSIS — L601 Onycholysis: Secondary | ICD-10-CM

## 2024-03-02 DIAGNOSIS — L821 Other seborrheic keratosis: Secondary | ICD-10-CM

## 2024-03-02 MED ORDER — AZELAIC ACID 15 % EX GEL: CUTANEOUS | 5 refills | Status: AC

## 2024-03-02 NOTE — Progress Notes (Signed)
 "   Subjective   Jaime Fletcher is a 54 y.o. female who presents for the following: Total body skin exam for skin cancer screening and mole check. The patient has spots, moles and lesions to be evaluated, some may be new or changing and the patient may have concern these could be cancer.. Patient is new patient  Today patient reports: Rosacea - flared over the past year, today is a good day. Using OTC medications which has helped some.  Review of Systems:    No other skin or systemic complaints except as noted in HPI or Assessment and Plan.  The following portions of the chart were reviewed this encounter and updated as appropriate: medications, allergies, medical history  Relevant Medical History:  n/a   Objective  (SKPE) Well appearing patient in no apparent distress; mood and affect are within normal limits. Examination was performed of the: Full Skin Examination: scalp, head, eyes, ears, nose, lips, neck, chest, axillae, abdomen, back, buttocks, bilateral upper extremities, bilateral lower extremities, hands, feet, fingers, toes, fingernails, and toenails.   Examination notable for: SKIN EXAM, Angioma(s): Scattered red vascular papule(s)  , Lentigo/lentigines: Scattered pigmented macules that are tan to brown in color and are somewhat non-uniform in shape and concentrated in the sun-exposed areas, Nevus/nevi: Scattered well-demarcated, regular, pigmented macule(s) and/or papule(s)  , Seborrheic Keratosis(es): Stuck-on appearing keratotic papule(s) on the trunk, some  irritated with redness, crusting, edema, and/or partial avulsion, Actinic Damage/Elastosis: chronic sun damage: dyspigmentation, telangiectasia, and wrinkling,  telangiectasias and erythema on face and chest KP arms and legs  Well healed scar L anterior thigh  R ring finger distal aspect with green yellow tint. -  Onycholysis L great toe  Examination limited by: n/a         Assessment & Plan  (SKAP)   SKIN CANCER  SCREENING PERFORMED TODAY.  BENIGN SKIN FINDINGS  - Lentigines  - Seborrheic keratoses  - Hemangiomas   - Nevus/Multiple Benign Nevi  - Telangiectasias  - Reassurance provided regarding the benign appearance of lesions noted on exam today; no treatment is indicated in the absence of symptoms/changes. - Reinforced importance of photoprotective strategies including liberal and frequent sunscreen use of a broad-spectrum SPF 30 or greater, use of protective clothing, and sun avoidance for prevention of cutaneous malignancy and photoaging.  Counseled patient on the importance of regular self-skin monitoring as well as routine clinical skin examinations as scheduled.   ACTINIC DAMAGE - Chronic condition, secondary to cumulative UV/sun exposure - Recommend daily broad spectrum sunscreen SPF 30+ to sun-exposed areas, reapply every 2 hours as needed.  - Staying in the shade or wearing long sleeves, sun glasses (UVA+UVB protection) and wide brim hats (4-inch brim around the entire circumference of the hat) are also recommended for sun protection.  - Call for new or changing lesions.  Personal history of dysplastic nevi  - Reviewed medical history for full details  - Reviewed sun protective measures as above - Encouraged full body skin exams     Rosacea Chronic and persistent condition with duration or expected duration over one year. Condition is symptomatic and bothersome to patient. Patient is flaring and not currently at treatment goal.   - Reviewed etiology and probable recurrent nature of this condition - Discussed potential triggers including caffeine, heat, sun exposure, chocolate, etc. and recommended patient attempt to identify and avoid triggers - Start compounded Azelaic Acid  15%, Metronidazole  1%, Ivermectin 1% Cream from [facility] - Sun avoidance, protective clothing sunscreen discussed -  Telangiectasias will likely not fade completely, laser removal may be considered as a  cosmetic procedure.  - Provided info on BBL - she will message if she would like done. Photos taken   Keratosis pilaris - informed the patient that this is a benign common inherited disorder of unknown etiology that is characterized by keratin plugging of follicular orifices - discussed with the patient that various conditions are associated with this benign condition such as: high BMI, dry scaly legs, atopy, pregnancy, and hyperandrogenism in women - discussed the treatment strategy which centers around decreasing excessive skin roughness and follicular accentuation - recommend starting a mild over-the-counter cream with alpha-hydroxy-acids (lactic acid), such as Amlactin  R ring finger distal aspect with green yellow tint. - favor infectious  Recommend vinegar soaks  Advised to contact office if not growing out   Simple onycholysis L great toe secondary to trauma  - Counseled patient about etiology, natural history, and treatment options for the condition - Recommended [place] nail conditioner to apply to nails at bedtime and/or Isdn nail strengthener daily     Was sun protection counseling provided?: Yes   Level of service outlined above   Patient instructions (SKPI)   Procedures, orders, diagnosis for this visit:    There are no diagnoses linked to this encounter.  Return to clinic: Return in about 1 year (around 03/02/2025) for TBSE.  Jaime Fletcher, CMA, am acting as scribe for Jaime Jaime Kanaris, MD .  Documentation: I have reviewed the above documentation for accuracy and completeness, and I agree with the above.  Jaime Jaime Kanaris, MD  "

## 2024-03-02 NOTE — Patient Instructions (Addendum)
 Recommend Elta MD [facility] Recovery daily.  Instructions for [facility] Medications  One or more of your medications was sent to the [facility] mail order compounding pharmacy. You will receive an email from them and can purchase the medicine through that link. It will then be mailed to your home at the address you confirmed. If for any reason you do not receive an email from them, please check your spam folder. If you still do not find the email, please let us  know. [facility] phone number is [phone].  INSTRUCTION FOR DILUTE WHITE VINEGAR SOAKS:    A white vinegar soak is made of 1 part white vinegar and 3 parts water.  Soak the affected area twice a day for 5 minutes.  After each soak, dry the areas well then apply the prescription medication (if we have recommended one).        Counseling for BBL / IPL / Laser and Coordination of Care Discussed the treatment option of Broad Band Light (BBL) /Intense Pulsed Light (IPL)/ Laser for skin discoloration, including brown spots and redness.  Typically we recommend at least 1-3 treatment sessions about 5-8 weeks apart for best results.  Cannot have tanned skin when BBL performed, and regular use of sunscreen/photoprotection is advised after the procedure to help maintain results. The patient's condition may also require maintenance treatments in the future.  The fee for BBL / laser treatments is $350 per treatment session for the whole face.  A fee can be quoted for other parts of the body.  Insurance typically does not pay for BBL/laser treatments and therefore the fee is an out-of-pocket cost. Recommend prophylactic valtrex  treatment. Once scheduled for procedure, will send Rx in prior to patient's appointment.    Due to recent changes in healthcare laws, you may see results of your pathology and/or laboratory studies on MyChart before the doctors have had a chance to review them. We understand that in some cases there may be results  that are confusing or concerning to you. Please understand that not all results are received at the same time and often the doctors may need to interpret multiple results in order to provide you with the best plan of care or course of treatment. Therefore, we ask that you please give us  2 business days to thoroughly review all your results before contacting the office for clarification. Should we see a critical lab result, you will be contacted sooner.   If You Need Anything After Your Visit  If you have any questions or concerns for your doctor, please call our main line at [phone] and press option 4 to reach your doctor's medical assistant. If no one answers, please leave a voicemail as directed and we will return your call as soon as possible. Messages left after 4 pm will be answered the following business day.   You may also send us  a message via MyChart. We typically respond to MyChart messages within 1-2 business days.  For prescription refills, please ask your pharmacy to contact our office. Our fax number is [phone].  If you have an urgent issue when the clinic is closed that cannot wait until the next business day, you can page your doctor at the number below.    Please note that while we do our best to be available for urgent issues outside of office hours, we are not available 24/7.   If you have an urgent issue and are unable to reach us , you may choose to  seek medical care at your doctor's office, retail clinic, urgent care center, or emergency room.  If you have a medical emergency, please immediately call 911 or go to the emergency department.  Pager Numbers  - Dr. Hester: [phone]  - Dr. Jackquline: [phone]  - Dr. Claudene: [phone]   - Dr. Raymund: [phone]  In the event of inclement weather, please call our main line at [phone] for an update on the status of any delays or closures.  Dermatology Medication Tips: Please keep the boxes that  topical medications come in in order to help keep track of the instructions about where and how to use these. Pharmacies typically print the medication instructions only on the boxes and not directly on the medication tubes.   If your medication is too expensive, please contact our office at [phone] option 4 or send us  a message through MyChart.   We are unable to tell what your co-pay for medications will be in advance as this is different depending on your insurance coverage. However, we may be able to find a substitute medication at lower cost or fill out paperwork to get insurance to cover a needed medication.   If a prior authorization is required to get your medication covered by your insurance company, please allow us  1-2 business days to complete this process.  Drug prices often vary depending on where the prescription is filled and some pharmacies may offer cheaper prices.  The website www.goodrx.com contains coupons for medications through different pharmacies. The prices here do not account for what the cost may be with help from insurance (it may be cheaper with your insurance), but the website can give you the price if you did not use any insurance.  - You can print the associated coupon and take it with your prescription to the pharmacy.  - You may also stop by our office during regular business hours and pick up a GoodRx coupon card.  - If you need your prescription sent electronically to a different pharmacy, notify our office through [facility] or by phone at [phone] option 4.     Si Usted Necesita Algo Despus de Su Visita  Tambin puede enviarnos un mensaje a travs de Clinical Cytogeneticist. Por lo general respondemos a los mensajes de MyChart en el transcurso de 1 a 2 das hbiles.  Para renovar recetas, por favor pida a su farmacia que se ponga en contacto con nuestra oficina. Randi lakes de fax es [place] [phone].  Si tiene un asunto urgente cuando la clnica  est cerrada y que no puede esperar hasta el siguiente da hbil, puede llamar/localizar a su doctor(a) al nmero que aparece a continuacin.   Por favor, tenga en cuenta que aunque hacemos todo lo posible para estar disponibles para asuntos urgentes fuera del horario de [place], no estamos disponibles las 24 horas del da, los 7 [address]  [address] de la [place].   Si tiene un problema urgente y no puede comunicarse con nosotros, puede optar por buscar atencin mdica  en el consultorio de su doctor(a), en una clnica privada, en un centro de atencin urgente o en una sala de emergencias.  Si tiene engineer, drilling, por favor llame inmediatamente al 911 o vaya a la sala de emergencias.  Nmeros de bper  - Dr. Hester: [phone]  - Dra. Jackquline: [phone]  - Dr. Claudene: [phone]  - Dra. Kitts: [phone]  En caso de inclemencias del [place], por favor llame a nuestra lnea principal al [phone] para ignacia actualizacin  sobre el estado de cualquier retraso o cierre.  Consejos para la medicacin en dermatologa: Por favor, guarde las cajas en las que vienen los medicamentos de uso tpico para ayudarle a seguir las instrucciones sobre dnde y cmo usarlos. Las farmacias generalmente imprimen las instrucciones del medicamento slo en las cajas y no directamente en los tubos del [place].   Si su medicamento es muy caro, por favor, pngase en contacto con landry rieger llamando al [phone] y presione la opcin 4 o envenos un mensaje a travs de Clinical Cytogeneticist.   No podemos decirle cul ser su copago por los medicamentos por adelantado ya que esto es diferente dependiendo de la cobertura de su seguro. Sin embargo, es posible que podamos encontrar un medicamento sustituto a audiological scientist un formulario para que el seguro cubra el medicamento que se considera necesario.   Si se requiere una autorizacin previa para que su compaa de seguros cubra su medicamento, por favor permtanos  de 1 a 2 das hbiles para completar este proceso.  Los precios de los medicamentos varan con frecuencia dependiendo del environmental consultant de dnde se surte la receta y alguna farmacias pueden ofrecer precios ms baratos.  El sitio web www.goodrx.com tiene cupones para medicamentos de health and safety inspector. Los precios aqu no tienen en cuenta lo que podra costar con la ayuda del seguro (puede ser ms barato con su seguro), pero el sitio web puede darle el precio si no utiliz tourist information centre manager.  - Puede imprimir el cupn correspondiente y llevarlo con su receta a la farmacia.  - Tambin puede pasar por nuestra oficina durante el horario de atencin regular y education officer, museum una tarjeta de cupones de GoodRx.  - Si necesita que su receta se enve electrnicamente a una farmacia diferente, informe a nuestra oficina a travs de MyChart de [place] o por telfono llamando al [phone] y presione la opcin 4.

## 2024-03-05 LAB — GENECONNECT MOLECULAR SCREEN: Genetic Analysis Overall Interpretation: NEGATIVE

## 2025-03-02 ENCOUNTER — Ambulatory Visit
